# Patient Record
Sex: Female | Born: 1965 | Race: Black or African American | Hispanic: No | Marital: Single | Smoking: Current every day smoker
Health system: Southern US, Community
[De-identification: ages and names within clinical notes are randomized; demographics above are authoritative.]

## PROBLEM LIST (undated history)

## (undated) DIAGNOSIS — Z21 Asymptomatic human immunodeficiency virus [HIV] infection status: Secondary | ICD-10-CM

## (undated) DIAGNOSIS — A630 Anogenital (venereal) warts: Secondary | ICD-10-CM

## (undated) DIAGNOSIS — A159 Respiratory tuberculosis unspecified: Secondary | ICD-10-CM

## (undated) DIAGNOSIS — B029 Zoster without complications: Secondary | ICD-10-CM

## (undated) DIAGNOSIS — D649 Anemia, unspecified: Secondary | ICD-10-CM

## (undated) DIAGNOSIS — B20 Human immunodeficiency virus [HIV] disease: Secondary | ICD-10-CM

## (undated) HISTORY — PX: KNEE DEBRIDEMENT: SHX1894

## (undated) HISTORY — DX: Human immunodeficiency virus (HIV) disease: B20

## (undated) HISTORY — DX: Anogenital (venereal) warts: A63.0

## (undated) HISTORY — DX: Respiratory tuberculosis unspecified: A15.9

## (undated) HISTORY — DX: Anemia, unspecified: D64.9

## (undated) HISTORY — DX: Asymptomatic human immunodeficiency virus (hiv) infection status: Z21

## (undated) HISTORY — DX: Zoster without complications: B02.9

---

## 1998-11-02 ENCOUNTER — Encounter: Payer: Self-pay | Admitting: Emergency Medicine

## 1998-11-03 ENCOUNTER — Inpatient Hospital Stay (HOSPITAL_COMMUNITY): Admission: EM | Admit: 1998-11-03 | Discharge: 1998-11-04 | Payer: Self-pay | Admitting: Emergency Medicine

## 1998-11-04 ENCOUNTER — Encounter: Payer: Self-pay | Admitting: Neurosurgery

## 1999-09-20 ENCOUNTER — Ambulatory Visit (HOSPITAL_COMMUNITY): Admission: RE | Admit: 1999-09-20 | Discharge: 1999-09-20 | Payer: Self-pay | Admitting: Neurosurgery

## 2004-09-22 ENCOUNTER — Emergency Department (HOSPITAL_COMMUNITY): Admission: EM | Admit: 2004-09-22 | Discharge: 2004-09-22 | Payer: Self-pay | Admitting: Emergency Medicine

## 2004-11-21 ENCOUNTER — Ambulatory Visit: Payer: Self-pay | Admitting: Internal Medicine

## 2004-11-21 ENCOUNTER — Inpatient Hospital Stay (HOSPITAL_COMMUNITY): Admission: EM | Admit: 2004-11-21 | Discharge: 2004-12-01 | Payer: Self-pay | Admitting: Emergency Medicine

## 2004-11-23 ENCOUNTER — Ambulatory Visit: Payer: Self-pay | Admitting: Hematology and Oncology

## 2004-11-26 ENCOUNTER — Encounter (INDEPENDENT_AMBULATORY_CARE_PROVIDER_SITE_OTHER): Payer: Self-pay | Admitting: *Deleted

## 2004-11-26 LAB — CONVERTED CEMR LAB
CD4 Count: 180 microliters
CD4 T Cell Abs: 180

## 2004-12-05 ENCOUNTER — Ambulatory Visit: Payer: Self-pay | Admitting: Hematology and Oncology

## 2005-01-04 ENCOUNTER — Ambulatory Visit (HOSPITAL_COMMUNITY): Admission: RE | Admit: 2005-01-04 | Discharge: 2005-01-04 | Payer: Self-pay | Admitting: Hematology and Oncology

## 2005-01-05 ENCOUNTER — Ambulatory Visit: Payer: Self-pay | Admitting: Internal Medicine

## 2005-01-05 ENCOUNTER — Ambulatory Visit (HOSPITAL_COMMUNITY): Admission: RE | Admit: 2005-01-05 | Discharge: 2005-01-05 | Payer: Self-pay | Admitting: Internal Medicine

## 2005-01-05 ENCOUNTER — Encounter (INDEPENDENT_AMBULATORY_CARE_PROVIDER_SITE_OTHER): Payer: Self-pay | Admitting: *Deleted

## 2005-01-05 LAB — CONVERTED CEMR LAB
CD4 Count: 370 microliters
HIV 1 RNA Quant: 399 copies/mL

## 2005-01-20 ENCOUNTER — Ambulatory Visit: Payer: Self-pay | Admitting: Hematology and Oncology

## 2005-02-10 ENCOUNTER — Encounter: Admission: RE | Admit: 2005-02-10 | Discharge: 2005-02-10 | Payer: Self-pay | Admitting: Hematology and Oncology

## 2005-02-13 ENCOUNTER — Ambulatory Visit: Payer: Self-pay | Admitting: Internal Medicine

## 2005-03-13 ENCOUNTER — Encounter (INDEPENDENT_AMBULATORY_CARE_PROVIDER_SITE_OTHER): Payer: Self-pay | Admitting: *Deleted

## 2005-03-13 ENCOUNTER — Ambulatory Visit (HOSPITAL_COMMUNITY): Admission: RE | Admit: 2005-03-13 | Discharge: 2005-03-13 | Payer: Self-pay | Admitting: General Surgery

## 2005-06-20 ENCOUNTER — Ambulatory Visit: Payer: Self-pay | Admitting: Hematology and Oncology

## 2006-03-06 DIAGNOSIS — Z87898 Personal history of other specified conditions: Secondary | ICD-10-CM | POA: Insufficient documentation

## 2006-03-06 DIAGNOSIS — S0990XA Unspecified injury of head, initial encounter: Secondary | ICD-10-CM | POA: Insufficient documentation

## 2006-03-06 DIAGNOSIS — D539 Nutritional anemia, unspecified: Secondary | ICD-10-CM | POA: Insufficient documentation

## 2006-03-06 DIAGNOSIS — F17200 Nicotine dependence, unspecified, uncomplicated: Secondary | ICD-10-CM | POA: Insufficient documentation

## 2006-03-06 DIAGNOSIS — B20 Human immunodeficiency virus [HIV] disease: Secondary | ICD-10-CM | POA: Insufficient documentation

## 2006-03-06 DIAGNOSIS — K7689 Other specified diseases of liver: Secondary | ICD-10-CM | POA: Insufficient documentation

## 2006-03-06 DIAGNOSIS — Z8709 Personal history of other diseases of the respiratory system: Secondary | ICD-10-CM | POA: Insufficient documentation

## 2006-03-12 ENCOUNTER — Encounter (INDEPENDENT_AMBULATORY_CARE_PROVIDER_SITE_OTHER): Payer: Self-pay | Admitting: *Deleted

## 2006-03-12 LAB — CONVERTED CEMR LAB: Pap Smear: ABNORMAL

## 2006-03-14 ENCOUNTER — Encounter: Payer: Self-pay | Admitting: Internal Medicine

## 2006-03-25 ENCOUNTER — Encounter (INDEPENDENT_AMBULATORY_CARE_PROVIDER_SITE_OTHER): Payer: Self-pay | Admitting: *Deleted

## 2006-03-26 DIAGNOSIS — Z9889 Other specified postprocedural states: Secondary | ICD-10-CM

## 2006-03-29 ENCOUNTER — Ambulatory Visit: Payer: Self-pay | Admitting: Internal Medicine

## 2006-03-29 ENCOUNTER — Encounter: Admission: RE | Admit: 2006-03-29 | Discharge: 2006-03-29 | Payer: Self-pay | Admitting: *Deleted

## 2006-03-29 DIAGNOSIS — A630 Anogenital (venereal) warts: Secondary | ICD-10-CM

## 2006-03-29 LAB — CONVERTED CEMR LAB
ALT: 11 units/L (ref 0–35)
AST: 16 units/L (ref 0–37)
Albumin: 4.1 g/dL (ref 3.5–5.2)
Alkaline Phosphatase: 180 units/L — ABNORMAL HIGH (ref 39–117)
BUN: 11 mg/dL (ref 6–23)
CD4 Count: 340 microliters
CO2: 22 meq/L (ref 19–32)
Calcium: 9 mg/dL (ref 8.4–10.5)
Chloride: 107 meq/L (ref 96–112)
Cholesterol: 173 mg/dL (ref 0–200)
Creatinine, Ser: 0.55 mg/dL (ref 0.40–1.20)
Glucose, Bld: 94 mg/dL (ref 70–99)
HCT: 36.3 % (ref 36.0–46.0)
HDL: 56 mg/dL (ref >39)
HIV 1 RNA Quant: <50 copies/mL (ref <50)
HIV-1 RNA Quant, Log: <1.7 (ref <1.70)
Hemoglobin: 12 g/dL (ref 12.0–15.0)
LDL Cholesterol: 103 mg/dL — ABNORMAL HIGH (ref 0–99)
MCHC: 33.1 g/dL (ref 30.0–36.0)
MCV: 78.6 fL (ref 78.0–100.0)
Pap Smear: NORMAL
Platelets: 407 10*3/uL — ABNORMAL HIGH (ref 150–400)
Potassium: 4.1 meq/L (ref 3.5–5.3)
RBC: 4.62 M/uL (ref 3.87–5.11)
RDW: 16.5 % — ABNORMAL HIGH (ref 11.5–14.0)
Sodium: 140 meq/L (ref 135–145)
Total Bilirubin: 0.3 mg/dL (ref 0.3–1.2)
Total CHOL/HDL Ratio: 3.1
Total Protein: 7.9 g/dL (ref 6.0–8.3)
Triglycerides: 68 mg/dL (ref <150)
VLDL: 14 mg/dL (ref 0–40)
WBC: 6 10*3/uL (ref 4.0–10.5)

## 2006-04-04 ENCOUNTER — Encounter: Admission: RE | Admit: 2006-04-04 | Discharge: 2006-04-04 | Payer: Self-pay | Admitting: Hematology and Oncology

## 2007-01-20 ENCOUNTER — Emergency Department (HOSPITAL_COMMUNITY): Admission: EM | Admit: 2007-01-20 | Discharge: 2007-01-20 | Payer: Self-pay | Admitting: Emergency Medicine

## 2007-06-04 ENCOUNTER — Telehealth (INDEPENDENT_AMBULATORY_CARE_PROVIDER_SITE_OTHER): Payer: Self-pay | Admitting: *Deleted

## 2007-06-27 ENCOUNTER — Emergency Department (HOSPITAL_BASED_OUTPATIENT_CLINIC_OR_DEPARTMENT_OTHER): Admission: EM | Admit: 2007-06-27 | Discharge: 2007-06-27 | Payer: Self-pay | Admitting: Emergency Medicine

## 2008-01-13 ENCOUNTER — Emergency Department (HOSPITAL_BASED_OUTPATIENT_CLINIC_OR_DEPARTMENT_OTHER): Admission: EM | Admit: 2008-01-13 | Discharge: 2008-01-13 | Payer: Self-pay | Admitting: Emergency Medicine

## 2008-11-02 ENCOUNTER — Emergency Department (HOSPITAL_BASED_OUTPATIENT_CLINIC_OR_DEPARTMENT_OTHER): Admission: EM | Admit: 2008-11-02 | Discharge: 2008-11-02 | Payer: Self-pay | Admitting: Emergency Medicine

## 2009-12-06 ENCOUNTER — Encounter (INDEPENDENT_AMBULATORY_CARE_PROVIDER_SITE_OTHER): Payer: Self-pay | Admitting: *Deleted

## 2009-12-27 ENCOUNTER — Emergency Department (HOSPITAL_BASED_OUTPATIENT_CLINIC_OR_DEPARTMENT_OTHER)
Admission: EM | Admit: 2009-12-27 | Discharge: 2009-12-27 | Payer: Self-pay | Source: Home / Self Care | Admitting: Emergency Medicine

## 2010-02-15 NOTE — Miscellaneous (Signed)
  Clinical Lists Changes  Observations: Added new observation of YEARAIDSPOS: 2006  (12/06/2009 13:15) Added new observation of HIV STATUS: CDC-defined AIDS  (12/06/2009 13:15)

## 2010-05-30 ENCOUNTER — Inpatient Hospital Stay (HOSPITAL_COMMUNITY)
Admission: EM | Admit: 2010-05-30 | Discharge: 2010-06-02 | DRG: 607 | Disposition: A | Discharge status: Home / Self Care | Payer: Medicaid Other | Attending: Internal Medicine | Admitting: Internal Medicine

## 2010-05-30 ENCOUNTER — Emergency Department (HOSPITAL_COMMUNITY): Payer: Medicaid Other

## 2010-05-30 DIAGNOSIS — F17200 Nicotine dependence, unspecified, uncomplicated: Secondary | ICD-10-CM | POA: Diagnosis present

## 2010-05-30 DIAGNOSIS — D638 Anemia in other chronic diseases classified elsewhere: Secondary | ICD-10-CM | POA: Diagnosis present

## 2010-05-30 DIAGNOSIS — Z21 Asymptomatic human immunodeficiency virus [HIV] infection status: Secondary | ICD-10-CM | POA: Diagnosis present

## 2010-05-30 DIAGNOSIS — R220 Localized swelling, mass and lump, head: Principal | ICD-10-CM | POA: Diagnosis present

## 2010-05-30 DIAGNOSIS — R509 Fever, unspecified: Secondary | ICD-10-CM | POA: Diagnosis present

## 2010-05-30 DIAGNOSIS — L851 Acquired keratosis [keratoderma] palmaris et plantaris: Secondary | ICD-10-CM | POA: Diagnosis present

## 2010-05-30 DIAGNOSIS — Z87820 Personal history of traumatic brain injury: Secondary | ICD-10-CM

## 2010-05-30 LAB — POCT I-STAT, CHEM 8
BUN: 3 mg/dL — ABNORMAL LOW (ref 6–23)
Sodium: 136 mEq/L (ref 135–145)
TCO2: 27 mmol/L (ref 0–100)

## 2010-05-30 LAB — CBC
Platelets: 484 10*3/uL — ABNORMAL HIGH (ref 150–400)
RBC: 3.89 MIL/uL (ref 3.87–5.11)
WBC: 8.1 10*3/uL (ref 4.0–10.5)

## 2010-05-30 LAB — DIFFERENTIAL
Basophils Relative: 0 % (ref 0–1)
Eosinophils Absolute: 0.2 10*3/uL (ref 0.0–0.7)
Neutrophils Relative %: 71 % (ref 43–77)

## 2010-05-30 LAB — URINALYSIS, ROUTINE W REFLEX MICROSCOPIC
Glucose, UA: NEGATIVE mg/dL
Protein, ur: NEGATIVE mg/dL
pH: 7 (ref 5.0–8.0)

## 2010-05-30 LAB — BASIC METABOLIC PANEL
Chloride: 98 mEq/L (ref 96–112)
Potassium: 3.2 mEq/L — ABNORMAL LOW (ref 3.5–5.1)
Sodium: 135 mEq/L (ref 135–145)

## 2010-05-30 LAB — PREGNANCY, URINE: Preg Test, Ur: NEGATIVE

## 2010-05-30 MED ORDER — IOHEXOL 300 MG/ML  SOLN
100.0000 mL | Freq: Once | INTRAMUSCULAR | Status: AC | PRN
  Administered 2010-05-30: 100 mL via INTRAVENOUS

## 2010-05-31 DIAGNOSIS — R221 Localized swelling, mass and lump, neck: Secondary | ICD-10-CM

## 2010-05-31 DIAGNOSIS — J189 Pneumonia, unspecified organism: Secondary | ICD-10-CM

## 2010-05-31 DIAGNOSIS — B20 Human immunodeficiency virus [HIV] disease: Secondary | ICD-10-CM

## 2010-05-31 DIAGNOSIS — R220 Localized swelling, mass and lump, head: Secondary | ICD-10-CM

## 2010-05-31 DIAGNOSIS — A150 Tuberculosis of lung: Secondary | ICD-10-CM

## 2010-05-31 LAB — CRYPTOCOCCAL ANTIGEN: Crypto Ag: NEGATIVE

## 2010-05-31 LAB — LIPID PANEL
Cholesterol: 109 mg/dL (ref 0–200)
VLDL: 16 mg/dL (ref 0–40)

## 2010-05-31 LAB — T-HELPER CELLS (CD4) COUNT (NOT AT ARMC): CD4 T Cell Abs: 60 uL — ABNORMAL LOW (ref 400–2700)
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
## 2010-06-01 ENCOUNTER — Other Ambulatory Visit: Payer: Self-pay | Admitting: Interventional Radiology

## 2010-06-01 ENCOUNTER — Inpatient Hospital Stay (HOSPITAL_COMMUNITY): Payer: Medicaid Other

## 2010-06-01 DIAGNOSIS — R221 Localized swelling, mass and lump, neck: Secondary | ICD-10-CM

## 2010-06-01 DIAGNOSIS — A150 Tuberculosis of lung: Secondary | ICD-10-CM

## 2010-06-01 DIAGNOSIS — J189 Pneumonia, unspecified organism: Secondary | ICD-10-CM

## 2010-06-01 DIAGNOSIS — R220 Localized swelling, mass and lump, head: Secondary | ICD-10-CM

## 2010-06-01 DIAGNOSIS — B20 Human immunodeficiency virus [HIV] disease: Secondary | ICD-10-CM

## 2010-06-01 LAB — HEPATITIS PANEL, ACUTE
Hep B C IgM: NEGATIVE
Hepatitis B Surface Ag: NEGATIVE

## 2010-06-01 LAB — BASIC METABOLIC PANEL
Calcium: 8.2 mg/dL — ABNORMAL LOW (ref 8.4–10.5)
Creatinine, Ser: <0.47 mg/dL (ref 0.4–1.2)

## 2010-06-01 LAB — CBC
HCT: 27.9 % — ABNORMAL LOW (ref 36.0–46.0)
Platelets: 399 10*3/uL (ref 150–400)
RDW: 16.2 % — ABNORMAL HIGH (ref 11.5–15.5)
WBC: 7.2 10*3/uL (ref 4.0–10.5)

## 2010-06-01 LAB — PROTIME-INR
INR: 1.06 (ref 0.00–1.49)
Prothrombin Time: 14 seconds (ref 11.6–15.2)

## 2010-06-01 LAB — RPR: RPR Ser Ql: NONREACTIVE

## 2010-06-02 LAB — CBC
HCT: 28 % — ABNORMAL LOW (ref 36.0–46.0)
Hemoglobin: 8.6 g/dL — ABNORMAL LOW (ref 12.0–15.0)
MCH: 22.4 pg — ABNORMAL LOW (ref 26.0–34.0)
MCHC: 30.7 g/dL (ref 30.0–36.0)
RBC: 3.84 MIL/uL — ABNORMAL LOW (ref 3.87–5.11)

## 2010-06-02 LAB — IRON AND TIBC
Iron: 15 ug/dL — ABNORMAL LOW (ref 42–135)
Saturation Ratios: 6 % — ABNORMAL LOW (ref 20–55)
TIBC: 237 ug/dL — ABNORMAL LOW (ref 250–470)
UIBC: 222 ug/dL

## 2010-06-02 LAB — FERRITIN: Ferritin: 91 ng/mL (ref 10–291)

## 2010-06-02 LAB — VITAMIN B12: Vitamin B-12: 237 pg/mL (ref 211–911)

## 2010-06-03 LAB — WOUND CULTURE: Culture: NO GROWTH

## 2010-06-03 NOTE — Discharge Summary (Signed)
Melinda Nielsen, Melinda Nielsen               ACCOUNT NO.:  [id number]  MEDICAL RECORD NO.:  [id number]           PATIENT TYPE:  I  LOCATION:  1341                         FACILITY:  [facility]  PHYSICIAN:  Andreas Blower, MD       DATE OF BIRTH:  1965-05-05  DATE OF ADMISSION:  05/30/2010 DATE OF DISCHARGE:                              DISCHARGE SUMMARY   PRIMARY CARE PHYSICIAN:  None.  INFECTIOUS DISEASES DOCTOR:  Lacretia Leigh. Ninetta Lights, M.D.  DISCHARGE DIAGNOSIS: 1. Large painful neck mass, likely tuberculosis adenitis versus     possible lymphoma given of human immunodeficiency virus. 2. Human immunodeficiency virus/acquired immunodeficiency syndrome, CD-     4 count 60. 3. Anemia.  Anemia panel pending. 4. Dry skin. 5. Low-grade fever likely due to neck mass.  BRIEF ADMITTING HISTORY AND PHYSICAL:  [name] is a 45 year old African-American female who is HIV positive for several years with no therapy who presented to the ER with an enlarging painful mass on the right neck.  DISCHARGE MEDICATIONS: 1. Azithromycin 1200 mg p.o. every week. 2. Acetaminophen 650 mg every 6 hours as needed for fever. 3. Bactrim double strength 800/160 one tablet on Monday, Wednesday and     Friday. 4. Ferrous sulfate 325 mg p.o. day. 5. Percocet 5/325 one tablet p.o. q.6 h.  RADIOLOGY/IMAGING:  The patient had CT of the neck which showed a right mid to posterior neck mass measuring 3.6 x 4.2 with central liquefied appearance.  The patient had chest x-ray, two-view, which shows diffuse bilateral infiltrates.  This may be pneumonia.  Tuberculosis pneumonia IS likely given right neck mass and HIV.  The patient had ultrasound-guided right cervical aspiration and core biopsy on Jun 01, 2010.  CONSULTATIONS:  Dr. Ninetta Lights from Infectious Disease evaluated the patient.  LABORATORY DATA:  CBC shows white count of 7.1, hemoglobin 8.6, hematocrit 28.0, platelet count 463.  Electrolytes normal except sodium is  134, potassium 3.4, BUN 6, creatinine is less than 0.47, LDL is 73. Hepatitis panel was negative.  Urine pregnancy was negative.  UA was negative.  CD-4 count was 60.  RPR is nonreactive.  Cryptococcal antigen is negative.  GC and Chlamydia are negative.  Sputum culture pending. AFB smear is pending.  HOSPITAL COURSE BY PROBLEM: 1. Neck mass.  Given the patient's HIV status,  Infectious Disease was     consulted.  There is a possibility that this neck mass may be     due to the tuberculosis adenitis (Scrofula) versus possible     lymphoma. Infection Disease was consulted and the biopsy was done     and the results were pending.  Per the Infectious Disease, the     patient could be discharged home with using mask in public areas     until following up in clinic to discuss the biopsy results.     Antibiotics were not started until biopsy results came back. 2. HIV/AIDS.  CD-4 count was 60 after discussion with Infectious     Disease.  The patient was started on azithromycin 1200 mg p.o.     every week and Bactrim  double strength one tablet on Monday     and Friday for prophylaxis. 3. Anemia likely due to chronic disease.  Anemia panel has been sent     and is pending.  Started the patient on ferrous sulfate 325 mg p.o.     daily.  Based on anemia panel, could discontinue ferrous sulfate at     the next clinic visit if the patient has adequate iron stores. 4. Dry skin.  Place the patient on moisturizing solution. 5. Low-grade fever likely from possible tuberculosis adenitis versus     lymphoma from the neck mass.  Per Infectious Disease, okay to     discharge the patient until being seen in clinic.  DISPOSITION AND FOLLOWUP:  The patient to follow with Dr. Ninetta Lights with Infectious Disease on Jun 08, 2010, at 11:15 a.m..  I had a discussion with the patient and strongly encouraged her to keep that appointment.  Time spent on discharge, talking to the patient and coordinating care was 25  minutes.     Andreas Blower, MD     SR/MEDQ  D:  06/02/2010  T:  06/02/2010  Job:  [id number]  Electronically Signed by Wardell Heath Darrion Macaulay  on 06/03/2010 08:27:52 PM

## 2010-06-03 NOTE — Op Note (Signed)
NAME:  Melinda Nielsen, Melinda Nielsen NO.:  [id number]   MEDICAL RECORD NO.:  [id number]          PATIENT TYPE:  AMB   LOCATION:  DAY                          FACILITY:  [facility]   PHYSICIAN:  Leonie Man, M.D.   DATE OF BIRTH:  26-Oct-1965   DATE OF PROCEDURE:  03/13/2005  DATE OF DISCHARGE:                                 OPERATIVE REPORT   PREOPERATIVE DIAGNOSES:  1.  Anal condylomata.  2.  External hemorrhoidal disease.   POSTOPERATIVE DIAGNOSES:  1.  Anal condylomata.  2.  External hemorrhoidal disease.   PROCEDURE:  1.  Fulguration of anal condylomata.  2.  Excision of external hemorrhoidal disease.   SURGEON:  Leonie Man, M.D.   ASSISTANT:  OR tech.   ANESTHESIA:  General.   INDICATIONS:  The patient is a 45 year old female presenting primarily with  the rectal bleeding.  She is diagnosed to be HIV positive.  She has a CD-4  count which is greater than 300.  The patient is currently on Atripla and  Septra DS which she takes daily.  On examination of the rectum and anus  preoperatively, the patient was noted to have a significant ring of  condylomata just below the dentate line.  There were no external condylomata  noted but she also was noted to have very extensive anterior midline  external hemorrhoid which was also problematic for her.  She comes to the  operating room now after risks and potential benefits of surgery have been  discussed. All questions answered and consent obtained.  It should be noted  that the The patient noted the patient has undergone complete evaluation  with colonoscopy for colorectal disease and none was noted.   DESCRIPTION OF PROCEDURE:  Following the induction of satisfactory general  anesthesia, the patient was positioned in the lithotomy position and the  perianal tissues were prepped and draped to be included in a sterile  operative field.  We used special evacuating suction so as to reduce any old  viral clot into the  operating room.  Then the perianal tissues were then  infiltrated with 1% lidocaine with epinephrine and anoscope inserted into  the anus.  The condylomata were serially fulgurated using electrocautery in  the entire circumference of the anus.  At the end of this, using  electrocautery to dissect free the anterior midline hemorrhoid and cauterize  the space. The mucocutaneous defect was then closed with a running suture of  3-0 chromic suture.  All areas of dissection were  checked for hemostasis and noted be dry.  I used a Gelfoam patch placed  within the anus over the incision.  Sponge and instrument counts were  verified. The anesthetic reversed and the patient removed from the operating  room to the recovery room in stable condition.  She tolerated the procedure  well.      Leonie Man, M.D.  Electronically Signed     PB/MEDQ  D:  03/13/2005  T:  03/14/2005  Job:  [id number]

## 2010-06-06 LAB — HIV-1 RNA QUANT-NO REFLEX-BLD: HIV 1 RNA Quant: 43300 copies/mL — ABNORMAL HIGH (ref <20)

## 2010-06-08 ENCOUNTER — Ambulatory Visit (HOSPITAL_COMMUNITY)
Admission: RE | Admit: 2010-06-08 | Discharge: 2010-06-08 | Disposition: A | Discharge status: Home / Self Care | Payer: Self-pay | Source: Ambulatory Visit | Attending: Infectious Diseases | Admitting: Infectious Diseases

## 2010-06-08 ENCOUNTER — Encounter: Payer: Self-pay | Admitting: Infectious Diseases

## 2010-06-08 ENCOUNTER — Ambulatory Visit (INDEPENDENT_AMBULATORY_CARE_PROVIDER_SITE_OTHER): Payer: Self-pay | Admitting: Infectious Diseases

## 2010-06-08 DIAGNOSIS — B20 Human immunodeficiency virus [HIV] disease: Secondary | ICD-10-CM

## 2010-06-08 DIAGNOSIS — A318 Other mycobacterial infections: Secondary | ICD-10-CM | POA: Insufficient documentation

## 2010-06-08 DIAGNOSIS — F17200 Nicotine dependence, unspecified, uncomplicated: Secondary | ICD-10-CM | POA: Insufficient documentation

## 2010-06-08 DIAGNOSIS — K7689 Other specified diseases of liver: Secondary | ICD-10-CM | POA: Insufficient documentation

## 2010-06-08 DIAGNOSIS — A311 Cutaneous mycobacterial infection: Secondary | ICD-10-CM

## 2010-06-08 MED ORDER — EFAVIRENZ-EMTRICITAB-TENOFOVIR 600-200-300 MG PO TABS: 1.0000 | ORAL_TABLET | Freq: Every day | ORAL | Status: DC

## 2010-06-08 NOTE — Progress Notes (Signed)
Addended byNinetta Lights, Helina Hullum on: 06/08/2010 01:38 PM   Modules accepted: Orders

## 2010-06-08 NOTE — Progress Notes (Signed)
  Subjective:    Patient ID: Melinda Nielsen, female    DOB: 1965/12/25, 45 y.o.   MRN: [id number]  HPI 45 yo F with hx of HIV+ since 2006, dx with an ? Illness. She was previously on [place] but has been lost to follow up ~4 years. By her history, she has no history of drug failure.  She went to the ED 05-31-10 with 3 weeks of enlarging, painful right neck mass. She was seen at the onset of her illness in urgent care and given penicillin with only transient improvement. She has lost 5# during this period. She had a non-productive cough, no hemoptysis, no chest pain. She has no history of incarceration, homelessness.  In the hospital she was found to have a "right mid to  posterior neck mass measuring 3.6 x 4.2 cm with central liquefied appearance.  This appears to be due to multiple  enlarged   lymph nodes.  This extends into the right parapharyngeal space" on CT scan. Her CXR showed diffuse bilateral infiltrates. She underwent US guided aspirate (AFB stain negative, fungal stain negative, routine Cx negative). Her pathology showed - "FIBROSIS, HISTIOCYTIC INFILTRATES, INFLAMMATION AND FOCAL NECROSIS.  - NO MALIGNANCY IDENTIFIED." Her CD4 was 60, VL 43,300. She was d/c home on bactrim and azithromycin .  Has felt ok since d/c from hospital.    Review of Systems  Constitutional: Positive for fever and chills. Negative for unexpected weight change.  Respiratory: Positive for chest tightness and shortness of breath. Negative for cough.   Gastrointestinal: Negative for diarrhea and constipation.  Genitourinary: Negative for difficulty urinating.       Objective:   Physical Exam  Constitutional: She appears well-developed and well-nourished.  HENT:  Head:    Eyes: EOM are normal. Pupils are equal, round, and reactive to light.  Cardiovascular: Normal rate and regular rhythm.   Pulmonary/Chest: Effort normal and breath sounds normal. No respiratory distress. She has no wheezes. She has no rales.    Abdominal: Soft. Bowel sounds are normal. She exhibits no distension.  Lymphadenopathy:    She has no axillary adenopathy.          Assessment & Plan:

## 2010-06-08 NOTE — Assessment & Plan Note (Addendum)
Will send her to the health dept. Presume that this is tb til proven otherwise. I am assuming her chest discomfort is from this as well. Her ECG in the clinic is NSR 87 bpm. No st-t changes.

## 2010-06-08 NOTE — Assessment & Plan Note (Signed)
She has AIDS and needs to continue on her prophylactics. She will be restarted on her atripla. Will have her meet with assistance personal to get her onto ADAP and back on her meds. She is given condoms. She will Return to clinic in 6-7 weeks.

## 2010-06-16 ENCOUNTER — Ambulatory Visit: Payer: Self-pay

## 2010-06-16 ENCOUNTER — Other Ambulatory Visit: Payer: Self-pay | Admitting: Infectious Diseases

## 2010-06-16 DIAGNOSIS — A311 Cutaneous mycobacterial infection: Secondary | ICD-10-CM

## 2010-06-22 ENCOUNTER — Encounter: Payer: Self-pay | Admitting: Infectious Diseases

## 2010-06-23 ENCOUNTER — Other Ambulatory Visit: Payer: Self-pay | Admitting: *Deleted

## 2010-06-23 DIAGNOSIS — B20 Human immunodeficiency virus [HIV] disease: Secondary | ICD-10-CM

## 2010-06-23 MED ORDER — AZITHROMYCIN 600 MG PO TABS: 1200.0000 mg | ORAL_TABLET | ORAL | Status: DC

## 2010-06-23 MED ORDER — SULFAMETHOXAZOLE-TMP DS 800-160 MG PO TABS: 1.0000 | ORAL_TABLET | Freq: Every day | ORAL | Status: DC

## 2010-06-23 MED ORDER — EFAVIRENZ-EMTRICITAB-TENOFOVIR 600-200-300 MG PO TABS: 1.0000 | ORAL_TABLET | Freq: Every day | ORAL | Status: DC

## 2010-06-27 ENCOUNTER — Telehealth: Payer: Self-pay | Admitting: *Deleted

## 2010-06-27 NOTE — Telephone Encounter (Signed)
Patient scheduled appointment at [facility] ENT clinic for 07/06/10 at 1:00 pm with Dr. Dola Argyle.  Office note and Imaging reports faxed.  Patient needs to call financial services at [facility] at [phone].  Left patient voicemail to call back regarding appointment information. Wendall Mola CMA

## 2010-06-29 ENCOUNTER — Other Ambulatory Visit: Payer: Self-pay | Admitting: Infectious Diseases

## 2010-06-29 DIAGNOSIS — A311 Cutaneous mycobacterial infection: Secondary | ICD-10-CM

## 2010-06-29 DIAGNOSIS — B20 Human immunodeficiency virus [HIV] disease: Secondary | ICD-10-CM

## 2010-06-29 MED ORDER — FERROUS SULFATE 325 (65 FE) MG PO TABS: 325.0000 mg | ORAL_TABLET | Freq: Every day | ORAL | Status: DC

## 2010-06-29 MED ORDER — AZITHROMYCIN 600 MG PO TABS: 1200.0000 mg | ORAL_TABLET | ORAL | Status: DC

## 2010-06-29 MED ORDER — OXYCODONE-ACETAMINOPHEN 5-325 MG PO TABS: 1.0000 | ORAL_TABLET | Freq: Four times a day (QID) | ORAL | Status: DC | PRN

## 2010-06-30 ENCOUNTER — Telehealth: Payer: Self-pay | Admitting: *Deleted

## 2010-06-30 NOTE — Telephone Encounter (Signed)
Spoke with patient regarding a request she had made for Percocet she had received in the hospital.  She told me she no longer needed it.  Said she has been having some itching from what she thinks is eczema.  Has been taking Benadryl with no relief.  Said she will address this with Dr. Ninetta Lights at next appointment.   Wendall Mola CMA

## 2010-07-01 LAB — FUNGUS CULTURE W SMEAR

## 2010-07-05 ENCOUNTER — Other Ambulatory Visit: Payer: Self-pay | Admitting: Licensed Clinical Social Worker

## 2010-07-05 DIAGNOSIS — R52 Pain, unspecified: Secondary | ICD-10-CM

## 2010-07-05 MED ORDER — TRAMADOL HCL 50 MG PO TABS: 50.0000 mg | ORAL_TABLET | Freq: Four times a day (QID) | ORAL | Status: DC | PRN

## 2010-07-07 ENCOUNTER — Ambulatory Visit: Payer: Self-pay | Admitting: Infectious Diseases

## 2010-07-07 ENCOUNTER — Telehealth: Payer: Self-pay | Admitting: *Deleted

## 2010-07-07 DIAGNOSIS — IMO0002 Reserved for concepts with insufficient information to code with codable children: Secondary | ICD-10-CM

## 2010-07-07 NOTE — Telephone Encounter (Signed)
Patient was seen at [facility] ENT clinic yesterday.  Two days ago the mass on her neck came to a head and burst.  [facility]  Dr. Dola Argyle packed it and instructed patient that dressing needed to be changed every day.  They assumed the TB nurse that sees her everyday would be able to change it, was told the nurse could not change the dressing.  [facility] and they will try to arrange an appointment at the wound clinic.  For today's dressing change patient given a nurse appointment at 3:00 pm. Wendall Mola CMA

## 2010-07-08 ENCOUNTER — Encounter: Payer: Self-pay | Admitting: Infectious Diseases

## 2010-07-08 DIAGNOSIS — IMO0002 Reserved for concepts with insufficient information to code with codable children: Secondary | ICD-10-CM

## 2010-07-11 ENCOUNTER — Telehealth: Payer: Self-pay | Admitting: *Deleted

## 2010-07-11 ENCOUNTER — Ambulatory Visit: Payer: Self-pay

## 2010-07-11 ENCOUNTER — Encounter (HOSPITAL_BASED_OUTPATIENT_CLINIC_OR_DEPARTMENT_OTHER): Payer: Self-pay | Attending: Internal Medicine

## 2010-07-11 DIAGNOSIS — R599 Enlarged lymph nodes, unspecified: Secondary | ICD-10-CM | POA: Insufficient documentation

## 2010-07-11 DIAGNOSIS — A150 Tuberculosis of lung: Secondary | ICD-10-CM | POA: Insufficient documentation

## 2010-07-11 DIAGNOSIS — T8189XA Other complications of procedures, not elsewhere classified, initial encounter: Secondary | ICD-10-CM | POA: Insufficient documentation

## 2010-07-11 DIAGNOSIS — Z21 Asymptomatic human immunodeficiency virus [HIV] infection status: Secondary | ICD-10-CM | POA: Insufficient documentation

## 2010-07-11 DIAGNOSIS — Y838 Other surgical procedures as the cause of abnormal reaction of the patient, or of later complication, without mention of misadventure at the time of the procedure: Secondary | ICD-10-CM | POA: Insufficient documentation

## 2010-07-11 NOTE — Telephone Encounter (Signed)
Scheduled appointment for patient at wound center for 07/11/10 at 1:00 pm.  She has an open wound on right side of neck at the area where the mass had been. Was packed first at [facility] ENT clinic and twice here at this clinic.  Patient aware of appointment. Wendall Mola CMA

## 2010-07-12 NOTE — Assessment & Plan Note (Unsigned)
[facility]  NAME:  Melinda Nielsen, Melinda Nielsen                 ACCOUNT NO.:  [id number]  MEDICAL RECORD NO.:  [id number]      DATE OF BIRTH:  07-Jul-1965  PHYSICIAN:  Jonelle Sports. Sevier, M.D.  VISIT DATE:  07/11/2010                                  OFFICE VISIT   This 45 year old black female comes in for evaluation and advice regarding draining lesion in her right high anterior cervical area.  The patient has a fairly complex history with HIV positivity, for which she is currently on treatment and also for active TB (it is unknown to me at this point and from the records available to me whether this is an atypical TB or not).  Whatever, she has been seen at the ENT Clinic in [facility] where apparently this lymph node was drained and biopsied and found to show no malignancy and any cultures made at that time were still pending, it was cultured both for routine things as well as for acid fast fungus, etc.  They have had her packing the wound since the time that it was incised and drained and she has had some difficulty with that and a referral here was for [place] to see if there is a way we can assist her in that management.  In observation, her lymph node is still rather enlarged, but not nearly so tender she says that has been before and does not feel fluctuant. There is no persisting warmth or erythema in the area and the wound itself which measures 1.5 x 1.2 and is probable only to about 0.6 cm. It appears perfectly satisfactory with no significant active drainage at the moment.  It is my impression that this wound should be left open until the nature of the organism is known for certain and a decision then made whether to continue with appropriate and specific therapy or whether to consider an actual excision of the nodule.  Whatever, I think that any efforts to close this wound at this point are contraindicated.  I do not think realistically, however, that she will be  able to continue packing, it was such a small remaining tract and accordingly we have suggested she dress it externally to a level of her comfort and sanitation.  She is due to see [facility] again in [place] next week hopefully for a more detailed results of culture etc., although we are aware and she was advised that Mycobacterium cultures may well take 6-8 weeks before they can be considered negative.  She seems happy with this recommendation and with our dressing recommendations and will be set for followup here only on a p.r.n. basis.          ______________________________ Jonelle Sports Cheryll Cockayne, M.D.     RES/MEDQ  D:  07/11/2010  T:  07/12/2010  Job:  [id number]  cc:   Lacretia Leigh. Ninetta Lights, M.D.

## 2010-07-14 LAB — AFB CULTURE WITH SMEAR (NOT AT ARMC): Acid Fast Smear: NONE SEEN

## 2010-07-27 ENCOUNTER — Encounter: Payer: Self-pay | Admitting: Infectious Diseases

## 2010-07-27 ENCOUNTER — Ambulatory Visit (INDEPENDENT_AMBULATORY_CARE_PROVIDER_SITE_OTHER): Payer: Self-pay | Admitting: Infectious Diseases

## 2010-07-27 DIAGNOSIS — Z23 Encounter for immunization: Secondary | ICD-10-CM

## 2010-07-27 DIAGNOSIS — A311 Cutaneous mycobacterial infection: Secondary | ICD-10-CM

## 2010-07-27 DIAGNOSIS — L309 Dermatitis, unspecified: Secondary | ICD-10-CM

## 2010-07-27 DIAGNOSIS — L259 Unspecified contact dermatitis, unspecified cause: Secondary | ICD-10-CM

## 2010-07-27 DIAGNOSIS — A318 Other mycobacterial infections: Secondary | ICD-10-CM

## 2010-07-27 DIAGNOSIS — D539 Nutritional anemia, unspecified: Secondary | ICD-10-CM

## 2010-07-27 DIAGNOSIS — B20 Human immunodeficiency virus [HIV] disease: Secondary | ICD-10-CM

## 2010-07-27 MED ORDER — HYDROCORTISONE 1 % EX CREA: TOPICAL_CREAM | CUTANEOUS | Status: AC

## 2010-07-27 MED ORDER — PRENATAL RX 60-1 MG PO TABS: 1.0000 | ORAL_TABLET | Freq: Every day | ORAL | Status: AC

## 2010-07-27 NOTE — Assessment & Plan Note (Signed)
My great appreciation to Dr Roxan Hockey and health dept. Will cc them.

## 2010-07-27 NOTE — Assessment & Plan Note (Signed)
Start prenatal vitamins

## 2010-07-27 NOTE — Assessment & Plan Note (Addendum)
Will check her CD4 and VL today. Give her PNVX and condoms. Hopefully can stop azithro and bactrim.  See her back in 2-3 months.

## 2010-07-27 NOTE — Progress Notes (Signed)
  Subjective:    Patient ID: Melinda Nielsen, female    DOB: 1965/06/11, 45 y.o.   MRN: [id number]  HPI 45 yo F with hx of HIV+ since 2006, dx with an ? Illness. She was previously on [place] but has been lost to follow up ~4 years. By her history, she has no history of drug failure. She went to the ED 05-31-10 with 3 weeks of enlarging, painful right neck mass. She was seen at the onset of her illness in urgent care and given penicillin with only transient improvement. She has lost 5# during this period. She had a non-productive cough, no hemoptysis, no chest pain. She has no history of incarceration, homelessness.  In the hospital she was found to have a "right mid to posterior neck mass measuring 3.6 x 4.2 cm with central liquefied appearance. This appears to be due to multiple enlarged lymph nodes. This extends into the right parapharyngeal space" on CT scan. Her CXR showed diffuse bilateral infiltrates. She underwent US guided aspirate (AFB stain negative, fungal stain negative, routine Cx negative). Her pathology showed - "FIBROSIS, HISTIOCYTIC INFILTRATES, INFLAMMATION AND FOCAL NECROSIS. - NO MALIGNANCY IDENTIFIED." Her CD4 was 60, VL 43,300. Genotype background PR mutation  (K20R,L33Vs). She was d/c home on bactrim and azithromycin .  She returned to ID clinic 06-08-10 and was restarted on atripla. She has been eval at [facility] for consideration of resection of her scrofulous area but the area broke down and began to drain. It has since healed well (per [facility] notes). She was eval at health dept and was started on anti-TB medications.  [name] feels like her neck is getting better. No further d/c and wound has closed. Doing well with atripla and TB medications (she is unable to remember names).  Was having joint pain and was told she had gout. Also tired all the time and feels like her iron is low. Needs skin cream due to pruritis and dry skin.  Having some problems with nausea due to all her meds.   Review  of Systems     Objective:   Physical Exam  Constitutional: She appears well-developed and well-nourished.  Eyes: EOM are normal. Pupils are equal, round, and reactive to light.  Neck: Neck supple.    Cardiovascular: Normal rate, regular rhythm and normal heart sounds.   Pulmonary/Chest: Effort normal and breath sounds normal. No respiratory distress.  Abdominal: Soft. Bowel sounds are normal. She exhibits no distension. There is no tenderness.  Skin:             Assessment & Plan:

## 2010-07-27 NOTE — Progress Notes (Signed)
Addended by: Wendall Mola A on: 07/27/2010 12:22 PM   Modules accepted: Orders

## 2010-07-27 NOTE — Assessment & Plan Note (Signed)
Will try her on low dose steroid cream.

## 2010-07-28 LAB — COMPREHENSIVE METABOLIC PANEL
ALT: 17 U/L (ref 0–35)
AST: 26 U/L (ref 0–37)
Albumin: 4 g/dL (ref 3.5–5.2)
BUN: 10 mg/dL (ref 6–23)
Calcium: 9.2 mg/dL (ref 8.4–10.5)
Chloride: 102 mEq/L (ref 96–112)
Potassium: 4.2 mEq/L (ref 3.5–5.3)

## 2010-07-28 LAB — CBC
MCHC: 31.9 g/dL (ref 30.0–36.0)
RDW: 23.3 % — ABNORMAL HIGH (ref 11.5–15.5)

## 2010-07-28 LAB — HIV-1 RNA QUANT-NO REFLEX-BLD
HIV 1 RNA Quant: 87 copies/mL — ABNORMAL HIGH (ref <20)
HIV-1 RNA Quant, Log: 1.94 {Log} — ABNORMAL HIGH (ref <1.30)

## 2010-07-29 LAB — T-HELPER CELL (CD4) - (RCID CLINIC ONLY)
CD4 % Helper T Cell: 10 % — ABNORMAL LOW (ref 33–55)
CD4 T Cell Abs: 270 uL — ABNORMAL LOW (ref 400–2700)

## 2010-08-08 ENCOUNTER — Encounter (HOSPITAL_BASED_OUTPATIENT_CLINIC_OR_DEPARTMENT_OTHER): Payer: Self-pay | Attending: Internal Medicine

## 2010-08-08 DIAGNOSIS — T8189XA Other complications of procedures, not elsewhere classified, initial encounter: Secondary | ICD-10-CM | POA: Insufficient documentation

## 2010-08-08 DIAGNOSIS — Y838 Other surgical procedures as the cause of abnormal reaction of the patient, or of later complication, without mention of misadventure at the time of the procedure: Secondary | ICD-10-CM | POA: Insufficient documentation

## 2010-08-08 DIAGNOSIS — Z21 Asymptomatic human immunodeficiency virus [HIV] infection status: Secondary | ICD-10-CM | POA: Insufficient documentation

## 2010-08-08 DIAGNOSIS — R599 Enlarged lymph nodes, unspecified: Secondary | ICD-10-CM | POA: Insufficient documentation

## 2010-08-08 DIAGNOSIS — A150 Tuberculosis of lung: Secondary | ICD-10-CM | POA: Insufficient documentation

## 2010-08-22 ENCOUNTER — Other Ambulatory Visit: Payer: Self-pay | Admitting: Infectious Diseases

## 2010-08-22 ENCOUNTER — Ambulatory Visit
Admission: RE | Admit: 2010-08-22 | Discharge: 2010-08-22 | Disposition: A | Discharge status: Home / Self Care | Payer: No Typology Code available for payment source | Source: Ambulatory Visit | Attending: Infectious Diseases | Admitting: Infectious Diseases

## 2010-08-22 DIAGNOSIS — Z09 Encounter for follow-up examination after completed treatment for conditions other than malignant neoplasm: Secondary | ICD-10-CM

## 2010-10-17 ENCOUNTER — Other Ambulatory Visit: Payer: Self-pay | Admitting: *Deleted

## 2010-10-17 ENCOUNTER — Ambulatory Visit: Payer: No Typology Code available for payment source

## 2010-10-17 DIAGNOSIS — B20 Human immunodeficiency virus [HIV] disease: Secondary | ICD-10-CM

## 2010-10-17 MED ORDER — SULFAMETHOXAZOLE-TMP DS 800-160 MG PO TABS: 1.0000 | ORAL_TABLET | Freq: Every day | ORAL | Status: DC

## 2010-10-17 MED ORDER — AZITHROMYCIN 600 MG PO TABS: 1200.0000 mg | ORAL_TABLET | ORAL | Status: DC

## 2010-10-17 MED ORDER — EFAVIRENZ-EMTRICITAB-TENOFOVIR 600-200-300 MG PO TABS: 1.0000 | ORAL_TABLET | Freq: Every day | ORAL | Status: DC

## 2010-10-24 ENCOUNTER — Other Ambulatory Visit: Payer: Self-pay | Admitting: Infectious Diseases

## 2010-10-24 DIAGNOSIS — B20 Human immunodeficiency virus [HIV] disease: Secondary | ICD-10-CM

## 2010-10-31 ENCOUNTER — Other Ambulatory Visit: Payer: Self-pay | Admitting: Infectious Diseases

## 2010-10-31 ENCOUNTER — Other Ambulatory Visit (INDEPENDENT_AMBULATORY_CARE_PROVIDER_SITE_OTHER): Payer: Self-pay

## 2010-10-31 DIAGNOSIS — B20 Human immunodeficiency virus [HIV] disease: Secondary | ICD-10-CM

## 2010-10-31 LAB — CBC WITH DIFFERENTIAL/PLATELET
Basophils Absolute: 0.1 10*3/uL (ref 0.0–0.1)
Basophils Relative: 1 % (ref 0–1)
Eosinophils Absolute: 0.2 10*3/uL (ref 0.0–0.7)
Eosinophils Relative: 4 % (ref 0–5)
HCT: 29.6 % — ABNORMAL LOW (ref 36.0–46.0)
Hemoglobin: 9.3 g/dL — ABNORMAL LOW (ref 12.0–15.0)
MCH: 23.8 pg — ABNORMAL LOW (ref 26.0–34.0)
MCHC: 31.4 g/dL (ref 30.0–36.0)
MCV: 75.7 fL — ABNORMAL LOW (ref 78.0–100.0)
Monocytes Absolute: 0.3 10*3/uL (ref 0.1–1.0)
Monocytes Relative: 8 % (ref 3–12)
Neutro Abs: 1.7 10*3/uL (ref 1.7–7.7)
RDW: 18.6 % — ABNORMAL HIGH (ref 11.5–15.5)

## 2010-10-31 LAB — COMPREHENSIVE METABOLIC PANEL
ALT: 13 U/L (ref 0–35)
Albumin: 3.7 g/dL (ref 3.5–5.2)
Alkaline Phosphatase: 121 U/L — ABNORMAL HIGH (ref 39–117)
Glucose, Bld: 80 mg/dL (ref 70–99)
Potassium: 3.9 mEq/L (ref 3.5–5.3)
Sodium: 139 mEq/L (ref 135–145)
Total Bilirubin: 0.2 mg/dL — ABNORMAL LOW (ref 0.3–1.2)
Total Protein: 7.3 g/dL (ref 6.0–8.3)

## 2010-11-01 LAB — T-HELPER CELL (CD4) - (RCID CLINIC ONLY): CD4 % Helper T Cell: 8 % — ABNORMAL LOW (ref 33–55)

## 2010-11-07 LAB — HIV-1 RNA QUANT-NO REFLEX-BLD: HIV-1 RNA Quant, Log: <1.3 {Log} (ref <1.30)

## 2010-11-14 ENCOUNTER — Encounter: Payer: Self-pay | Admitting: Infectious Diseases

## 2010-11-14 ENCOUNTER — Ambulatory Visit (INDEPENDENT_AMBULATORY_CARE_PROVIDER_SITE_OTHER): Payer: Self-pay | Admitting: Infectious Diseases

## 2010-11-14 VITALS — BP 144/92 | HR 103 | Temp 97.6°F | Ht 66.0 in | Wt 168.0 lb

## 2010-11-14 DIAGNOSIS — B20 Human immunodeficiency virus [HIV] disease: Secondary | ICD-10-CM

## 2010-11-14 DIAGNOSIS — Z79899 Other long term (current) drug therapy: Secondary | ICD-10-CM

## 2010-11-14 DIAGNOSIS — A311 Cutaneous mycobacterial infection: Secondary | ICD-10-CM

## 2010-11-14 DIAGNOSIS — Z23 Encounter for immunization: Secondary | ICD-10-CM

## 2010-11-14 DIAGNOSIS — Z113 Encounter for screening for infections with a predominantly sexual mode of transmission: Secondary | ICD-10-CM

## 2010-11-14 NOTE — Assessment & Plan Note (Signed)
She appears to be doing well with the exception of incomplete immune reconstitution. She will continue on atripla. i let her know she can d/c the azithromycin. WIll schedule her for a PAP, mammo and to see dental. She is given condoms. She gets flushot today, PNVX up to date.

## 2010-11-14 NOTE — Assessment & Plan Note (Signed)
This appears to be improving nicely. She will continue to follow up with the health dept.

## 2010-11-14 NOTE — Progress Notes (Signed)
  Subjective:    Patient ID: Melinda Nielsen, female    DOB: 05-Dec-1965, 45 y.o.   MRN: [id number]  HPI 45 yo F with hx of HIV+ since 2006, dx with an ? Illness. She was previously on atripla butwas lost to follow up ~4 years. By her history, she has no history of drug failure. She went to the ED 05-31-10 with 3 weeks of enlarging, painful right neck mass. She was seen at the onset of her illness in urgent care and given penicillin with only transient improvement. She has lost 5# during this period. She had a non-productive cough, no hemoptysis, no chest pain. She has no history of incarceration, homelessness.  In the hospital she was found to have a "right mid to posterior neck mass measuring 3.6 x 4.2 cm with central liquefied appearance. This appears to be due to multiple enlarged lymph nodes. This extends into the right parapharyngeal space" on CT scan. Her CXR showed diffuse bilateral infiltrates. She underwent US guided aspirate (AFB stain negative, fungal stain negative, routine Cx negative). Her pathology showed - "FIBROSIS, HISTIOCYTIC INFILTRATES, INFLAMMATION AND FOCAL NECROSIS. - NO MALIGNANCY IDENTIFIED." Her CD4 was 60, VL 43,300. Genotype background PR mutation (K20R,L33Vs). She was d/c home on bactrim and azithromycin .  She returned to ID clinic 06-08-10 and was restarted on atripla. She has been eval at [facility] for consideration of resection of her scrofulous area but the area broke down and began to drain.She was eval at health dept and was started on anti-TB medications.  CD4 150, VL <20 (10-31-10). F/U CXR (09-01-10) persistent R basilar opacity. Neck has completely healed. Still taking TB medications. She can still feel a small knot at the previous spot, pain better. Has been gaining weight.   Review of Systems  Constitutional: Positive for fatigue. Negative for fever and chills.  Respiratory: Negative for cough and shortness of breath.   Gastrointestinal: Negative for diarrhea and  constipation.  Genitourinary: Negative for dysuria and menstrual problem.       Objective:   Physical Exam  Constitutional: She appears well-developed and well-nourished.  Eyes: EOM are normal. Pupils are equal, round, and reactive to light.  Neck: Neck supple.  Cardiovascular: Normal rate, regular rhythm and normal heart sounds.   Pulmonary/Chest: Effort normal and breath sounds normal.  Abdominal: Soft. Bowel sounds are normal. There is no tenderness.  Lymphadenopathy:    She has no cervical adenopathy.          Assessment & Plan:

## 2010-11-18 ENCOUNTER — Ambulatory Visit: Payer: Self-pay

## 2010-12-02 ENCOUNTER — Ambulatory Visit: Payer: Self-pay

## 2010-12-05 ENCOUNTER — Encounter: Payer: Self-pay | Admitting: *Deleted

## 2010-12-28 ENCOUNTER — Other Ambulatory Visit: Payer: Self-pay | Admitting: Infectious Diseases

## 2010-12-28 ENCOUNTER — Ambulatory Visit
Admission: RE | Admit: 2010-12-28 | Discharge: 2010-12-28 | Disposition: A | Discharge status: Home / Self Care | Payer: No Typology Code available for payment source | Source: Ambulatory Visit | Attending: Infectious Diseases | Admitting: Infectious Diseases

## 2010-12-28 DIAGNOSIS — Z5189 Encounter for other specified aftercare: Secondary | ICD-10-CM

## 2011-01-20 ENCOUNTER — Telehealth: Payer: Self-pay | Admitting: *Deleted

## 2011-01-20 ENCOUNTER — Other Ambulatory Visit: Payer: Self-pay | Admitting: *Deleted

## 2011-01-20 DIAGNOSIS — B20 Human immunodeficiency virus [HIV] disease: Secondary | ICD-10-CM

## 2011-01-20 MED ORDER — EFAVIRENZ-EMTRICITAB-TENOFOVIR 600-200-300 MG PO TABS: 1.0000 | ORAL_TABLET | Freq: Every day | ORAL | Status: DC

## 2011-01-20 MED ORDER — SULFAMETHOXAZOLE-TMP DS 800-160 MG PO TABS: 1.0000 | ORAL_TABLET | Freq: Every day | ORAL | Status: DC

## 2011-01-20 NOTE — Telephone Encounter (Signed)
Called patient in response to the message she left about not having her meds. She stated on the message that she usually has gotten a call by this time of the month and she has not yet. She did not say which meds or call from what agencies. I called the patient and she advised [facility] on [address] is where she needs her meds called in to. Told her i will do this asap.

## 2011-03-07 ENCOUNTER — Telehealth: Payer: Self-pay | Admitting: *Deleted

## 2011-03-07 NOTE — Telephone Encounter (Signed)
Requested that pt call [facility] to make PAP smear appt.

## 2011-03-24 ENCOUNTER — Other Ambulatory Visit: Payer: Self-pay | Admitting: *Deleted

## 2011-03-24 ENCOUNTER — Ambulatory Visit: Payer: No Typology Code available for payment source

## 2011-03-24 DIAGNOSIS — B20 Human immunodeficiency virus [HIV] disease: Secondary | ICD-10-CM

## 2011-03-24 MED ORDER — EFAVIRENZ-EMTRICITAB-TENOFOVIR 600-200-300 MG PO TABS: 1.0000 | ORAL_TABLET | Freq: Every day | ORAL | Status: DC

## 2011-06-21 ENCOUNTER — Encounter: Payer: Self-pay | Admitting: *Deleted

## 2011-07-26 ENCOUNTER — Other Ambulatory Visit: Payer: Self-pay | Admitting: *Deleted

## 2011-07-26 NOTE — Telephone Encounter (Signed)
Phone call to pt to clarify which rxes she needed refilled.  Pt stated that she obtained refills for the medications that she needed from [facility].  Scheduled pt for f/u appts for labs, MD and PAP smear.

## 2011-08-18 ENCOUNTER — Other Ambulatory Visit (INDEPENDENT_AMBULATORY_CARE_PROVIDER_SITE_OTHER): Payer: Self-pay

## 2011-08-18 ENCOUNTER — Other Ambulatory Visit (HOSPITAL_COMMUNITY)
Admission: RE | Admit: 2011-08-18 | Discharge: 2011-08-18 | Disposition: A | Discharge status: Home / Self Care | Payer: Self-pay | Source: Ambulatory Visit | Attending: Internal Medicine | Admitting: Internal Medicine

## 2011-08-18 ENCOUNTER — Ambulatory Visit (INDEPENDENT_AMBULATORY_CARE_PROVIDER_SITE_OTHER): Payer: Self-pay | Admitting: *Deleted

## 2011-08-18 DIAGNOSIS — Z124 Encounter for screening for malignant neoplasm of cervix: Secondary | ICD-10-CM

## 2011-08-18 DIAGNOSIS — Z0000 Encounter for general adult medical examination without abnormal findings: Secondary | ICD-10-CM

## 2011-08-18 DIAGNOSIS — Z1231 Encounter for screening mammogram for malignant neoplasm of breast: Secondary | ICD-10-CM

## 2011-08-18 DIAGNOSIS — Z113 Encounter for screening for infections with a predominantly sexual mode of transmission: Secondary | ICD-10-CM

## 2011-08-18 DIAGNOSIS — Z01419 Encounter for gynecological examination (general) (routine) without abnormal findings: Secondary | ICD-10-CM | POA: Insufficient documentation

## 2011-08-18 DIAGNOSIS — B20 Human immunodeficiency virus [HIV] disease: Secondary | ICD-10-CM

## 2011-08-18 DIAGNOSIS — Z79899 Other long term (current) drug therapy: Secondary | ICD-10-CM

## 2011-08-18 LAB — COMPLETE METABOLIC PANEL WITH GFR
AST: 27 U/L (ref 0–37)
Alkaline Phosphatase: 125 U/L — ABNORMAL HIGH (ref 39–117)
GFR, Est Non African American: >89 mL/min
Glucose, Bld: 97 mg/dL (ref 70–99)
Sodium: 140 mEq/L (ref 135–145)
Total Bilirubin: 0.2 mg/dL — ABNORMAL LOW (ref 0.3–1.2)
Total Protein: 6.8 g/dL (ref 6.0–8.3)

## 2011-08-18 LAB — RPR

## 2011-08-18 LAB — CBC
Hemoglobin: 9.1 g/dL — ABNORMAL LOW (ref 12.0–15.0)
MCH: 22.4 pg — ABNORMAL LOW (ref 26.0–34.0)
MCHC: 29.6 g/dL — ABNORMAL LOW (ref 30.0–36.0)
Platelets: 334 10*3/uL (ref 150–400)
RDW: 19.6 % — ABNORMAL HIGH (ref 11.5–15.5)

## 2011-08-18 LAB — LIPID PANEL
HDL: 44 mg/dL (ref >39)
Total CHOL/HDL Ratio: 3.3 Ratio
VLDL: 11 mg/dL (ref 0–40)

## 2011-08-18 NOTE — Progress Notes (Signed)
  Subjective:     Melinda Nielsen is a 46 y.o. woman who comes in today for a  pap smear only. Contraception: condoms  Objective:    There were no vitals taken for this visit.  LMP 08/14/11 Pelvic Exam:Pap smear obtained.   Assessment:    Screening pap smear.   Plan:    Follow up in one year, or as indicated by Pap results.  Pt was given educational materials re:  BSE, heart disease and HIV, PAP smears, HIV care, ACA, Protecting your partner and self-esteem.  Pt given condoms.

## 2011-08-18 NOTE — Patient Instructions (Signed)
Your results will be ready in about a week.  I will mail them to you.  Thank you for coming to the Center for your care.  Roquel Burgin,  RN 

## 2011-08-23 ENCOUNTER — Other Ambulatory Visit: Payer: Self-pay | Admitting: *Deleted

## 2011-08-23 ENCOUNTER — Encounter: Payer: Self-pay | Admitting: Infectious Diseases

## 2011-08-23 ENCOUNTER — Encounter: Payer: Self-pay | Admitting: *Deleted

## 2011-08-23 ENCOUNTER — Ambulatory Visit (INDEPENDENT_AMBULATORY_CARE_PROVIDER_SITE_OTHER): Payer: Self-pay | Admitting: Infectious Diseases

## 2011-08-23 VITALS — BP 145/89 | HR 92 | Temp 97.5°F | Ht 67.0 in | Wt 189.0 lb

## 2011-08-23 DIAGNOSIS — J329 Chronic sinusitis, unspecified: Secondary | ICD-10-CM | POA: Insufficient documentation

## 2011-08-23 DIAGNOSIS — Z79899 Other long term (current) drug therapy: Secondary | ICD-10-CM

## 2011-08-23 DIAGNOSIS — F17200 Nicotine dependence, unspecified, uncomplicated: Secondary | ICD-10-CM

## 2011-08-23 DIAGNOSIS — R609 Edema, unspecified: Secondary | ICD-10-CM

## 2011-08-23 DIAGNOSIS — D539 Nutritional anemia, unspecified: Secondary | ICD-10-CM

## 2011-08-23 DIAGNOSIS — Z113 Encounter for screening for infections with a predominantly sexual mode of transmission: Secondary | ICD-10-CM

## 2011-08-23 DIAGNOSIS — B20 Human immunodeficiency virus [HIV] disease: Secondary | ICD-10-CM

## 2011-08-23 LAB — HIV-1 RNA QUANT-NO REFLEX-BLD
HIV 1 RNA Quant: <20 copies/mL (ref <20)
HIV-1 RNA Quant, Log: <1.3 {Log} (ref <1.30)

## 2011-08-23 MED ORDER — AZITHROMYCIN 250 MG PO TABS: ORAL_TABLET | ORAL | Status: DC

## 2011-08-23 MED ORDER — AZITHROMYCIN 500 MG PO TABS: 500.0000 mg | ORAL_TABLET | Freq: Every day | ORAL | Status: DC

## 2011-08-23 NOTE — Assessment & Plan Note (Signed)
BP is mildly elevated. Will have her eval in IM clinic. 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

## 2011-08-23 NOTE — Assessment & Plan Note (Signed)
Will give her rx for tri-pack.

## 2011-08-23 NOTE — Progress Notes (Signed)
  Subjective:    Patient ID: Melinda Nielsen, female    DOB: October 26, 1965, 46 y.o.   MRN: [id number]  HPI 46 yo F with hx of HIV+ since 2006. She was previously on atripla butwas lost to follow up ~4 years. By her history, she has no history of drug failure. She went to the ED 05-31-10 with 3 weeks of enlarging, painful right neck mass. She has lost 5# during this period. She had a non-productive cough, no hemoptysis, no chest pain. She has no history of incarceration, homelessness.  In the hospital she was found to have a "right mid to posterior neck mass measuring 3.6 x 4.2 cm with central liquefied appearance. This appears to be due to multiple enlarged lymph nodes. This extends into the right parapharyngeal space" on CT scan. Her CXR showed diffuse bilateral infiltrates. She underwent US guided aspirate (AFB stain negative, fungal stain negative, routine Cx negative). Her pathology showed - "FIBROSIS, HISTIOCYTIC INFILTRATES, INFLAMMATION AND FOCAL NECROSIS. - NO MALIGNANCY IDENTIFIED." Her CD4 was 60, VL 43,300. Genotype background PR mutation (K20R,L33Vs). She was d/c home on bactrim and azithromycin .  She returned to ID clinic 06-08-10 and was restarted on atripla. She has been eval at [facility] for consideration of resection of her scrofulous area but the area broke down and began to drain.She was eval at health dept and was started on anti-TB medications. She completed these on December 2012.  Has gained weight since previous visit. Has had some problems with fluid retention in feet, joints. Feels like she got a "bug" while in waiting room with her son in the hospital/ED. No f/c. Sinus pressure, rhinorrhea. No cough.   Neck swelling resolved.  Has noted some small skin lesions with her cycle.   HIV 1 RNA Quant (copies/mL)  Date Value  10/31/2010 <20   07/27/2010 87*  06/02/2010 43300*     CD4 T Cell Abs (cmm)  Date Value  08/18/2011 220*  10/31/2010 150*  07/27/2010 270*      Review of  Systems     Objective:   Physical Exam  Constitutional: She appears well-developed and well-nourished.  HENT:  Mouth/Throat: Abnormal dentition. Dental caries present. No oropharyngeal exudate.  Eyes: EOM are normal. Pupils are equal, round, and reactive to light.  Neck: Neck supple.  Cardiovascular: Normal rate, regular rhythm and normal heart sounds.   Pulmonary/Chest: Effort normal and breath sounds normal.  Abdominal: Soft. Bowel sounds are normal. She exhibits no distension. There is no tenderness.  Lymphadenopathy:    She has no cervical adenopathy.          Assessment & Plan:

## 2011-08-23 NOTE — Assessment & Plan Note (Signed)
Will resume her Iron (she is currently off). Consider EGD.Marland KitchenMarland Kitchen

## 2011-08-23 NOTE — Assessment & Plan Note (Signed)
Encouraged to quit. Difficult due to her "stress level"

## 2011-08-23 NOTE — Assessment & Plan Note (Addendum)
She is doing ok. CD4 is stable. She has condoms from her previous PAP visit. Prev Hep B SAb +. rtc 6 months.

## 2011-10-10 ENCOUNTER — Ambulatory Visit (HOSPITAL_COMMUNITY): Admission: RE | Admit: 2011-10-10 | Payer: Self-pay | Source: Ambulatory Visit

## 2011-11-02 ENCOUNTER — Ambulatory Visit: Payer: Self-pay

## 2011-11-07 ENCOUNTER — Ambulatory Visit (HOSPITAL_COMMUNITY): Payer: Self-pay | Attending: Infectious Diseases

## 2012-02-27 NOTE — Progress Notes (Signed)
This encounter was created in error - please disregard.

## 2012-04-29 ENCOUNTER — Ambulatory Visit: Payer: Self-pay

## 2012-07-10 ENCOUNTER — Telehealth: Payer: Self-pay | Admitting: *Deleted

## 2012-07-10 DIAGNOSIS — B20 Human immunodeficiency virus [HIV] disease: Secondary | ICD-10-CM

## 2012-07-10 MED ORDER — SULFAMETHOXAZOLE-TMP DS 800-160 MG PO TABS: 1.0000 | ORAL_TABLET | Freq: Every day | ORAL | Status: DC

## 2012-07-10 MED ORDER — EFAVIRENZ-EMTRICITAB-TENOFOVIR 600-200-300 MG PO TABS: 1.0000 | ORAL_TABLET | Freq: Every day | ORAL | Status: DC

## 2012-07-10 NOTE — Telephone Encounter (Signed)
Phone call to [facility] to ascertain pattern of pt refilling Atripla.  Pt started refilling Atripla @ [facility] 12/11/11 (#30), 01/12/12 (#30), 02/19/12 (#30), 03/17/12 (#30), 06/03/12 (#30).  Pt is currently out of refills.  Last OV w/ Dr. Ninetta Lights 08/23/11.  [facility] given 1 refill for Atripla and Septra DS with a note to the pt to call [facility] for lab work and MD appts for further refills.

## 2012-07-22 ENCOUNTER — Other Ambulatory Visit: Payer: Self-pay | Admitting: Licensed Clinical Social Worker

## 2012-07-22 DIAGNOSIS — B20 Human immunodeficiency virus [HIV] disease: Secondary | ICD-10-CM

## 2012-07-22 NOTE — Telephone Encounter (Signed)
Refill request faxed from pharmacy for Atripla and Bactrim, denied because patient hasn't kept appointment.

## 2012-08-01 ENCOUNTER — Ambulatory Visit: Payer: Self-pay

## 2012-08-08 ENCOUNTER — Other Ambulatory Visit: Payer: Self-pay | Admitting: *Deleted

## 2012-08-08 DIAGNOSIS — B20 Human immunodeficiency virus [HIV] disease: Secondary | ICD-10-CM

## 2012-08-08 MED ORDER — SULFAMETHOXAZOLE-TMP DS 800-160 MG PO TABS: 1.0000 | ORAL_TABLET | Freq: Every day | ORAL | Status: DC

## 2012-08-08 MED ORDER — EFAVIRENZ-EMTRICITAB-TENOFOVIR 600-200-300 MG PO TABS: 1.0000 | ORAL_TABLET | Freq: Every day | ORAL | Status: DC

## 2012-08-13 ENCOUNTER — Other Ambulatory Visit (INDEPENDENT_AMBULATORY_CARE_PROVIDER_SITE_OTHER): Payer: Self-pay

## 2012-08-13 DIAGNOSIS — Z79899 Other long term (current) drug therapy: Secondary | ICD-10-CM

## 2012-08-13 DIAGNOSIS — B20 Human immunodeficiency virus [HIV] disease: Secondary | ICD-10-CM

## 2012-08-13 DIAGNOSIS — Z113 Encounter for screening for infections with a predominantly sexual mode of transmission: Secondary | ICD-10-CM

## 2012-08-13 LAB — CBC
HCT: 30.9 % — ABNORMAL LOW (ref 36.0–46.0)
MCH: 20.1 pg — ABNORMAL LOW (ref 26.0–34.0)
MCV: 66.2 fL — ABNORMAL LOW (ref 78.0–100.0)
Platelets: 403 10*3/uL — ABNORMAL HIGH (ref 150–400)
RBC: 4.67 MIL/uL (ref 3.87–5.11)
WBC: 4.8 10*3/uL (ref 4.0–10.5)

## 2012-08-13 LAB — COMPREHENSIVE METABOLIC PANEL
BUN: 13 mg/dL (ref 6–23)
CO2: 20 mEq/L (ref 19–32)
Calcium: 8.9 mg/dL (ref 8.4–10.5)
Chloride: 105 mEq/L (ref 96–112)
Creat: 0.7 mg/dL (ref 0.50–1.10)
Glucose, Bld: 115 mg/dL — ABNORMAL HIGH (ref 70–99)
Total Bilirubin: 0.2 mg/dL — ABNORMAL LOW (ref 0.3–1.2)

## 2012-08-13 LAB — LIPID PANEL
Cholesterol: 162 mg/dL (ref 0–200)
HDL: 47 mg/dL (ref >39)
Total CHOL/HDL Ratio: 3.4 Ratio
Triglycerides: 63 mg/dL (ref <150)
VLDL: 13 mg/dL (ref 0–40)

## 2012-08-14 LAB — T-HELPER CELL (CD4) - (RCID CLINIC ONLY): CD4 T Cell Abs: 250 uL — ABNORMAL LOW (ref 400–2700)

## 2012-08-26 ENCOUNTER — Ambulatory Visit (INDEPENDENT_AMBULATORY_CARE_PROVIDER_SITE_OTHER): Payer: Self-pay | Admitting: Infectious Diseases

## 2012-08-26 ENCOUNTER — Encounter: Payer: Self-pay | Admitting: Infectious Diseases

## 2012-08-26 VITALS — BP 147/98 | HR 101 | Temp 97.9°F | Ht 67.0 in | Wt 210.0 lb

## 2012-08-26 DIAGNOSIS — B20 Human immunodeficiency virus [HIV] disease: Secondary | ICD-10-CM

## 2012-08-26 DIAGNOSIS — A311 Cutaneous mycobacterial infection: Secondary | ICD-10-CM

## 2012-08-26 NOTE — Assessment & Plan Note (Signed)
resolved 

## 2012-08-26 NOTE — Progress Notes (Signed)
  Subjective:    Patient ID: Melinda Nielsen, female    DOB: [birth year], 47 y.o.   MRN: [id number]  HPI  47 yo F with hx of HIV+ since 2006. She was previously on [place] but was lost to follow up ~4 years. By her history, she has no history of drug failure. She went to the ED 05-31-10 with 3 weeks of enlarging, painful right neck mass. In the hospital she was found to have a "right mid to posterior neck mass measuring 3.6 x 4.2 cm with central liquefied appearance. This appears to be due to multiple enlarged lymph nodes. This extends into the right parapharyngeal space" on CT scan. Her CXR showed diffuse bilateral infiltrates. She underwent US guided aspirate (AFB stain negative, fungal stain negative, routine Cx negative). Her pathology showed - "FIBROSIS, HISTIOCYTIC INFILTRATES, INFLAMMATION AND FOCAL NECROSIS. - NO MALIGNANCY IDENTIFIED." Her CD4 was 60, VL 43,300. Genotype background PR mutation (K20R,L33Vs). She returned to ID clinic 06-08-10 and was restarted on atripla. She has been eval at [facility] for consideration of resection of her scrofulous area but the area broke down and began to drain.She was eval at health dept and was started on anti-TB medications. She completed these on December 2012.   HIV 1 RNA Quant (copies/mL)  Date Value  08/13/2012 20   08/18/2011 <20   10/31/2010 <20      CD4 T Cell Abs (cmm)  Date Value  08/13/2012 250*  08/18/2011 220*  10/31/2010 150*   No problems with her neck: wound closed, no induration, swelling.  No problems with atripla.  Has been gaining wt.     Review of Systems  Constitutional: Negative for appetite change and unexpected weight change.  Gastrointestinal: Negative for diarrhea and constipation.  Genitourinary: Negative for dysuria and menstrual problem.       Objective:   Physical Exam  Constitutional: She appears well-developed and well-nourished.  HENT:  Mouth/Throat: No oropharyngeal exudate.  Eyes: EOM are normal. Pupils are  equal, round, and reactive to light.  Neck: Neck supple.  Cardiovascular: Normal rate, regular rhythm and normal heart sounds.   Pulmonary/Chest: Effort normal and breath sounds normal.  Abdominal: Soft. Bowel sounds are normal. There is no tenderness.  Lymphadenopathy:    She has no cervical adenopathy.          Assessment & Plan:

## 2012-08-26 NOTE — Assessment & Plan Note (Signed)
She is doing well. Will continue her current meds. She asks about staying on bactrim, i advised her that this could be doing her more harm than good. She agrees to stop this medication. Will set her up for dental, pap. See her back in 6 months

## 2012-09-05 ENCOUNTER — Other Ambulatory Visit: Payer: Self-pay | Admitting: Licensed Clinical Social Worker

## 2012-09-05 DIAGNOSIS — B20 Human immunodeficiency virus [HIV] disease: Secondary | ICD-10-CM

## 2012-09-05 MED ORDER — EFAVIRENZ-EMTRICITAB-TENOFOVIR 600-200-300 MG PO TABS: 1.0000 | ORAL_TABLET | Freq: Every day | ORAL | Status: DC

## 2012-10-02 IMAGING — CT CT NECK W/ CM
3 series · 13 of 20 positions shown, 15 images · IV contrast (agent unspecified)
Comparison: None

CLINICAL DATA: Right neck swelling.  Sore throat.  HIV

CT NECK WITH CONTRAST
TECHNIQUE: Multidetector CT imaging of the neck was performed with
intravenous contrast.
Contrast: 100 ml [id number]-7RR IV

[Series 3: neck rtn st · axial · 0.39mm/px · z∈[+984,+1098]mm · 3 of 78 slices shown]
[im 20/78  bone]
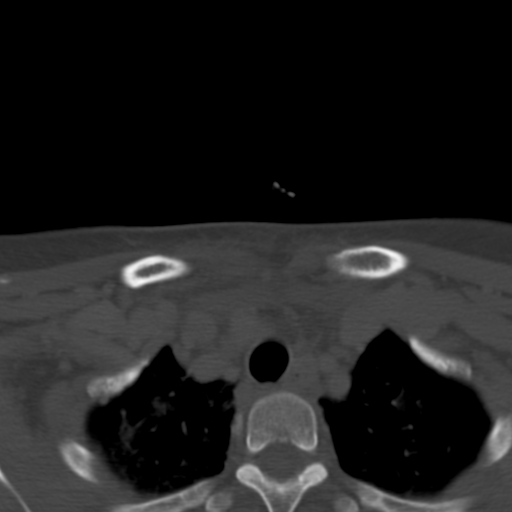
[im 39/78  bone]
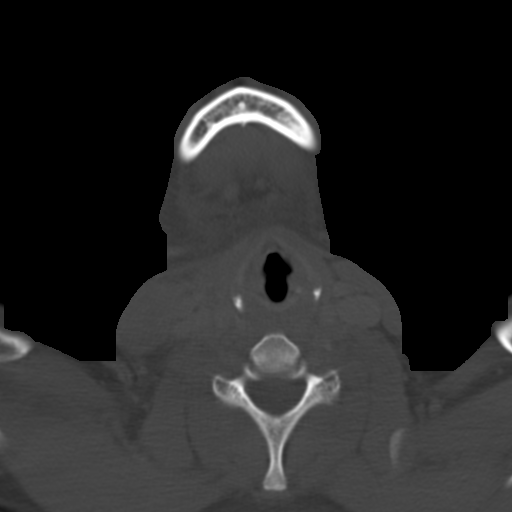
[im 58/78  bone]
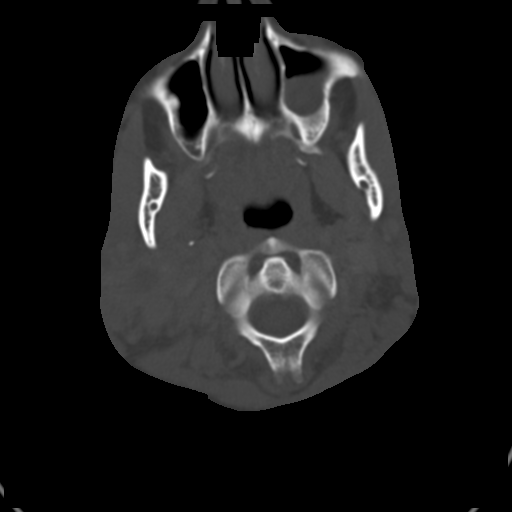

[Series 602: <mpr range> · axial · 0.46mm/px · z∈[+921,+1100]mm · 7 of 124 slices shown, 9 images]
[im 16/124  soft-tissue]
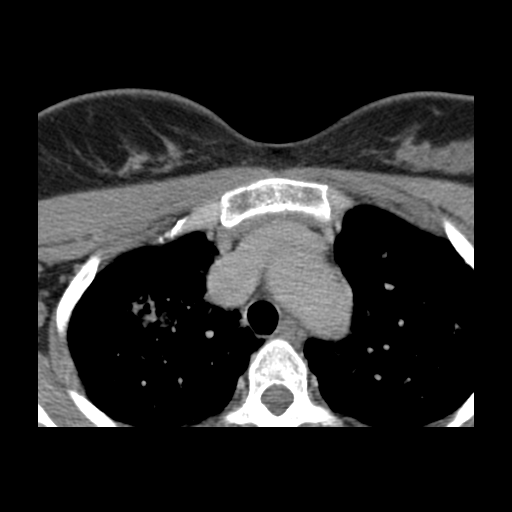
[im 16/124  bone]
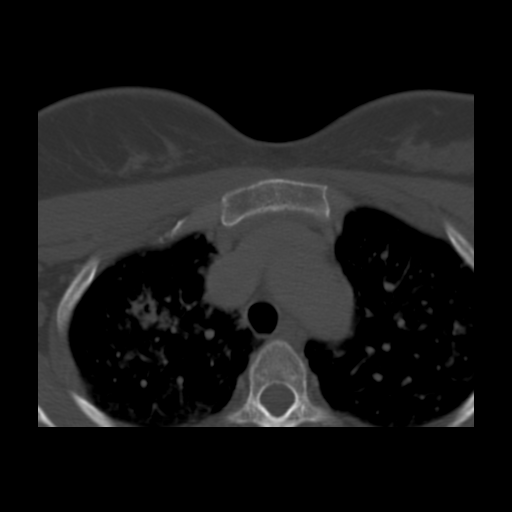
[im 31/124  bone]
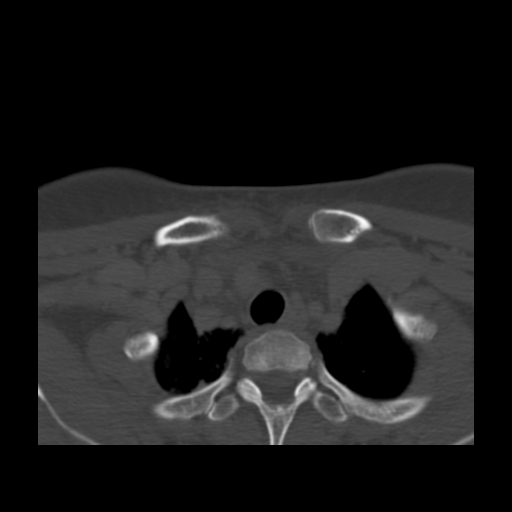
[im 47/124  bone]
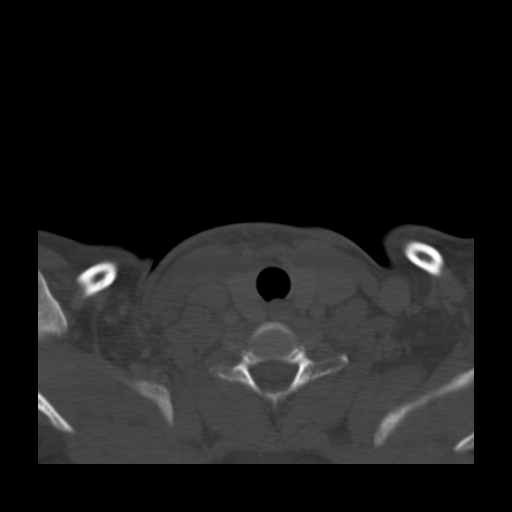
[im 62/124  bone]
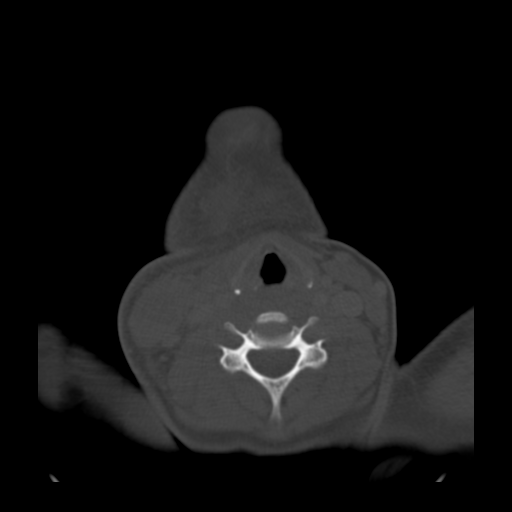
[im 77/124  soft-tissue]
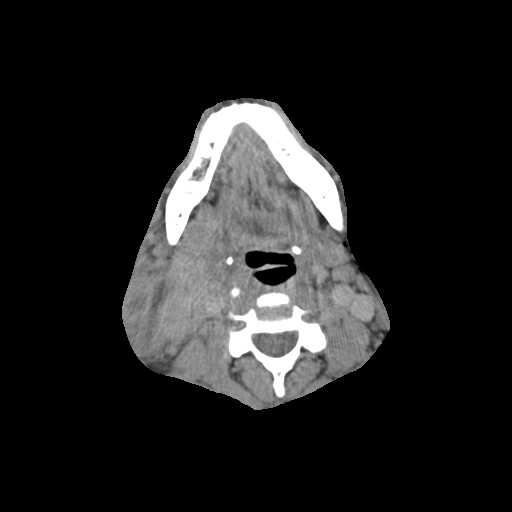
[im 77/124  bone]
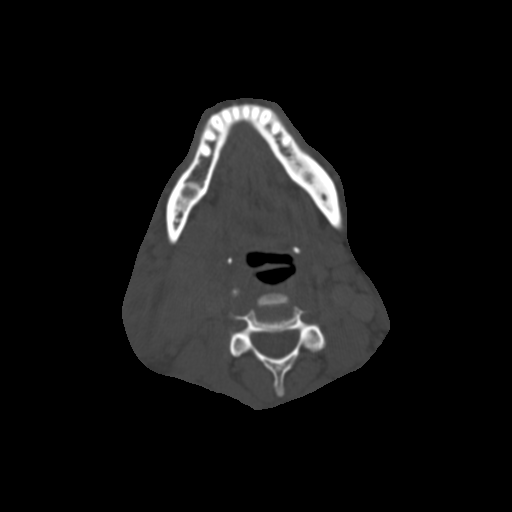
[im 93/124  bone]
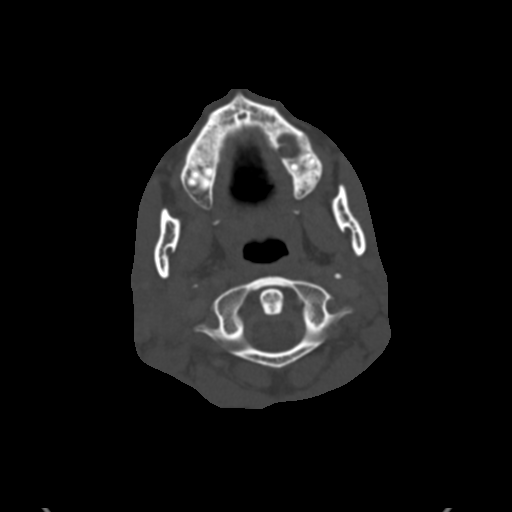
[im 108/124  bone]
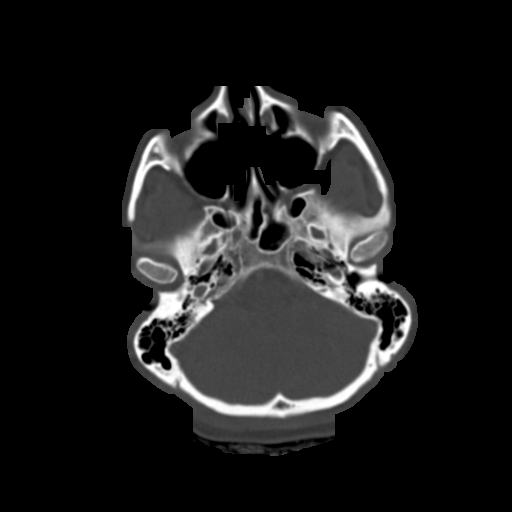

[Series 603: <mpr range(1)> · coronal · 0.46mm/px · 3 of 75 slices shown]
[im 16/75  bone]
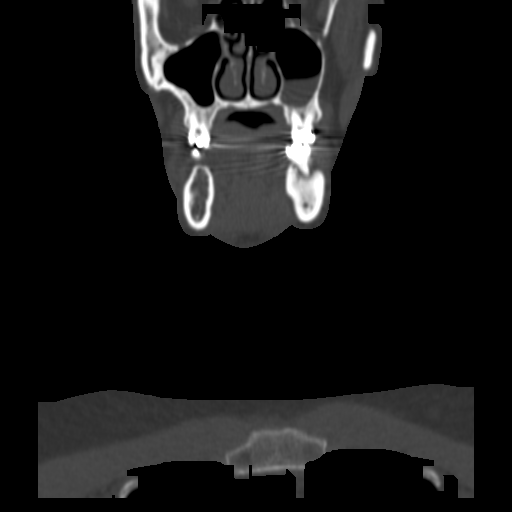
[im 30/75  bone]
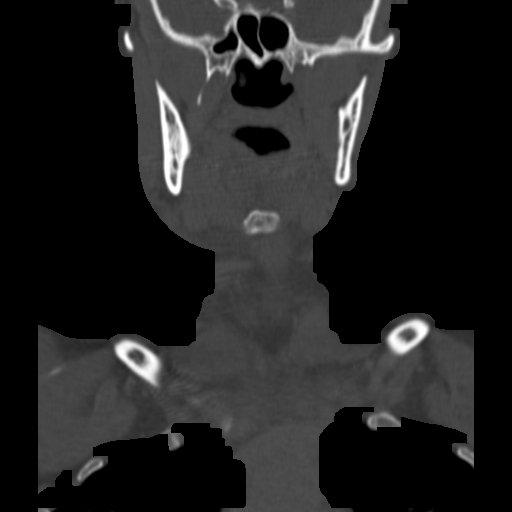
[im 59/75  bone]
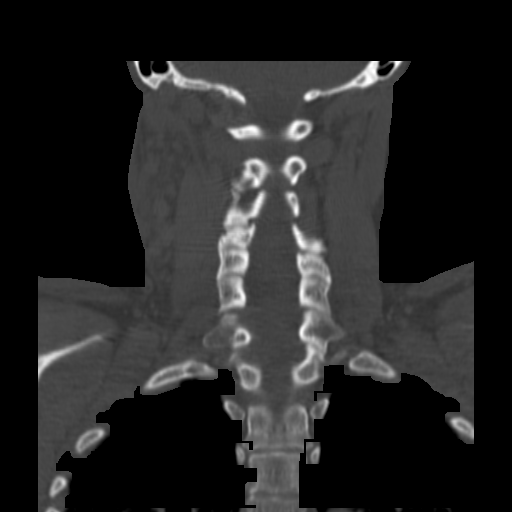

[13 of 20 positions shown; findings below may reference images not displayed]

FINDINGS: Mass in the right neck posterior to the angle of the
mandible.  This measures approximately 3.6 x 4.2 cm and has a
lobular contour and  appears to be a group of enlarged lymph nodes.
There is a liquefied center measuring 17 mm. The mass extends into
the parapharyngeal space.  Left level II lymph node measures 12 mm.
No lymph node mass on the left.

Submandibular and parotid gland is normal bilaterally.  No acute
abnormality in the spine.

Imaging through the lung apices reveals diffuse patchy infiltrate
with a small nodular component.  The lungs were clear on chest x-
ray of 06/27/2007. Chronic sinusitis left maxillary sinus.
IMPRESSION: Right mid to  posterior neck mass measuring 3.6 x 4.2 cm with
central liquefied appearance.  This appears to be due to multiple
enlarged   lymph nodes.  This extends into the right parapharyngeal
space.  This could be due to infection or neoplasm.  Given the
infiltrates  in the upper lobes and HIV status, the findings are
most compatible with tuberculous pneumonia and tuberculous adenitis
in the neck (scrofula).

## 2012-10-02 IMAGING — CR DG CHEST 2V
2 series · 2 of 2 positions shown · non-contrast
Comparison: 06/27/2007

CLINICAL DATA: Sore throat and swelling.  HIV

CHEST - 2 VIEW

[w chest pa]
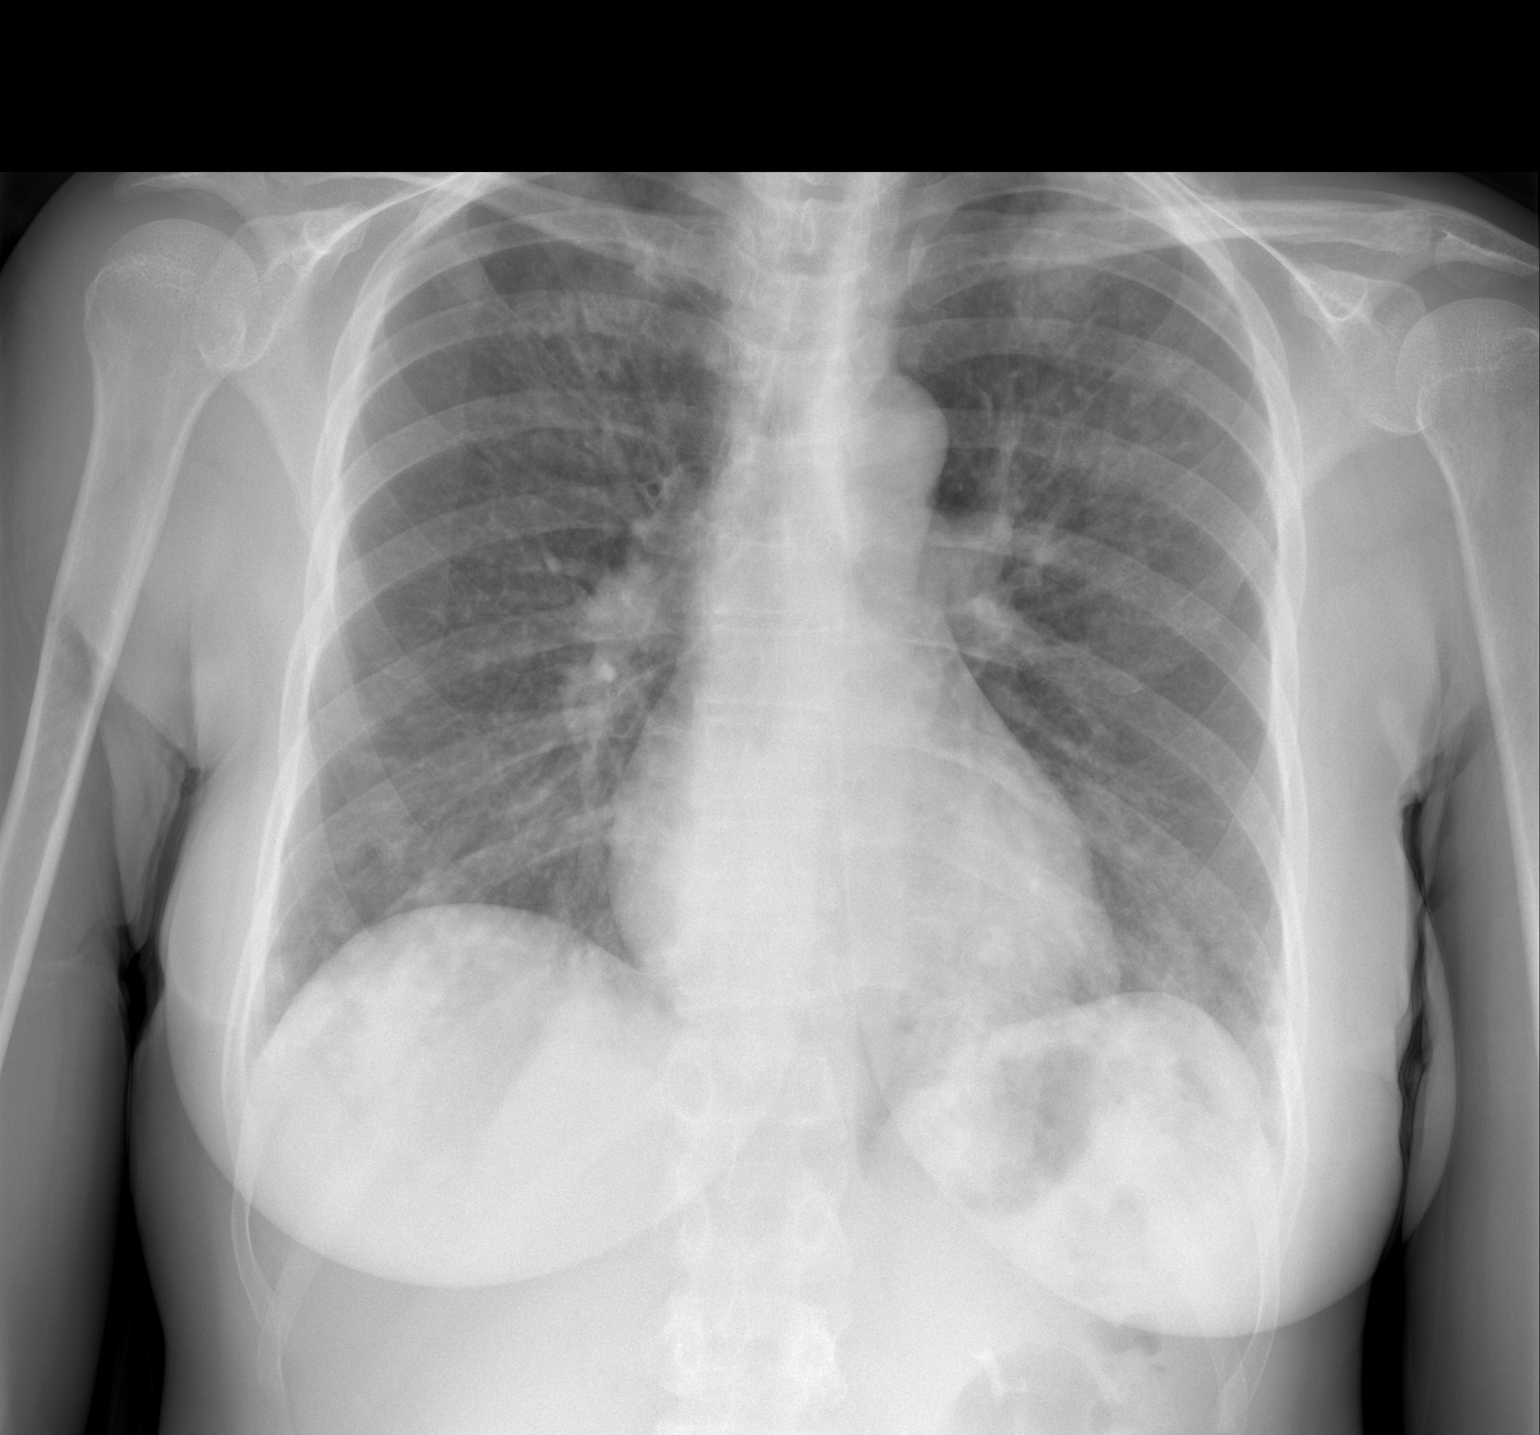

[w chest lat]
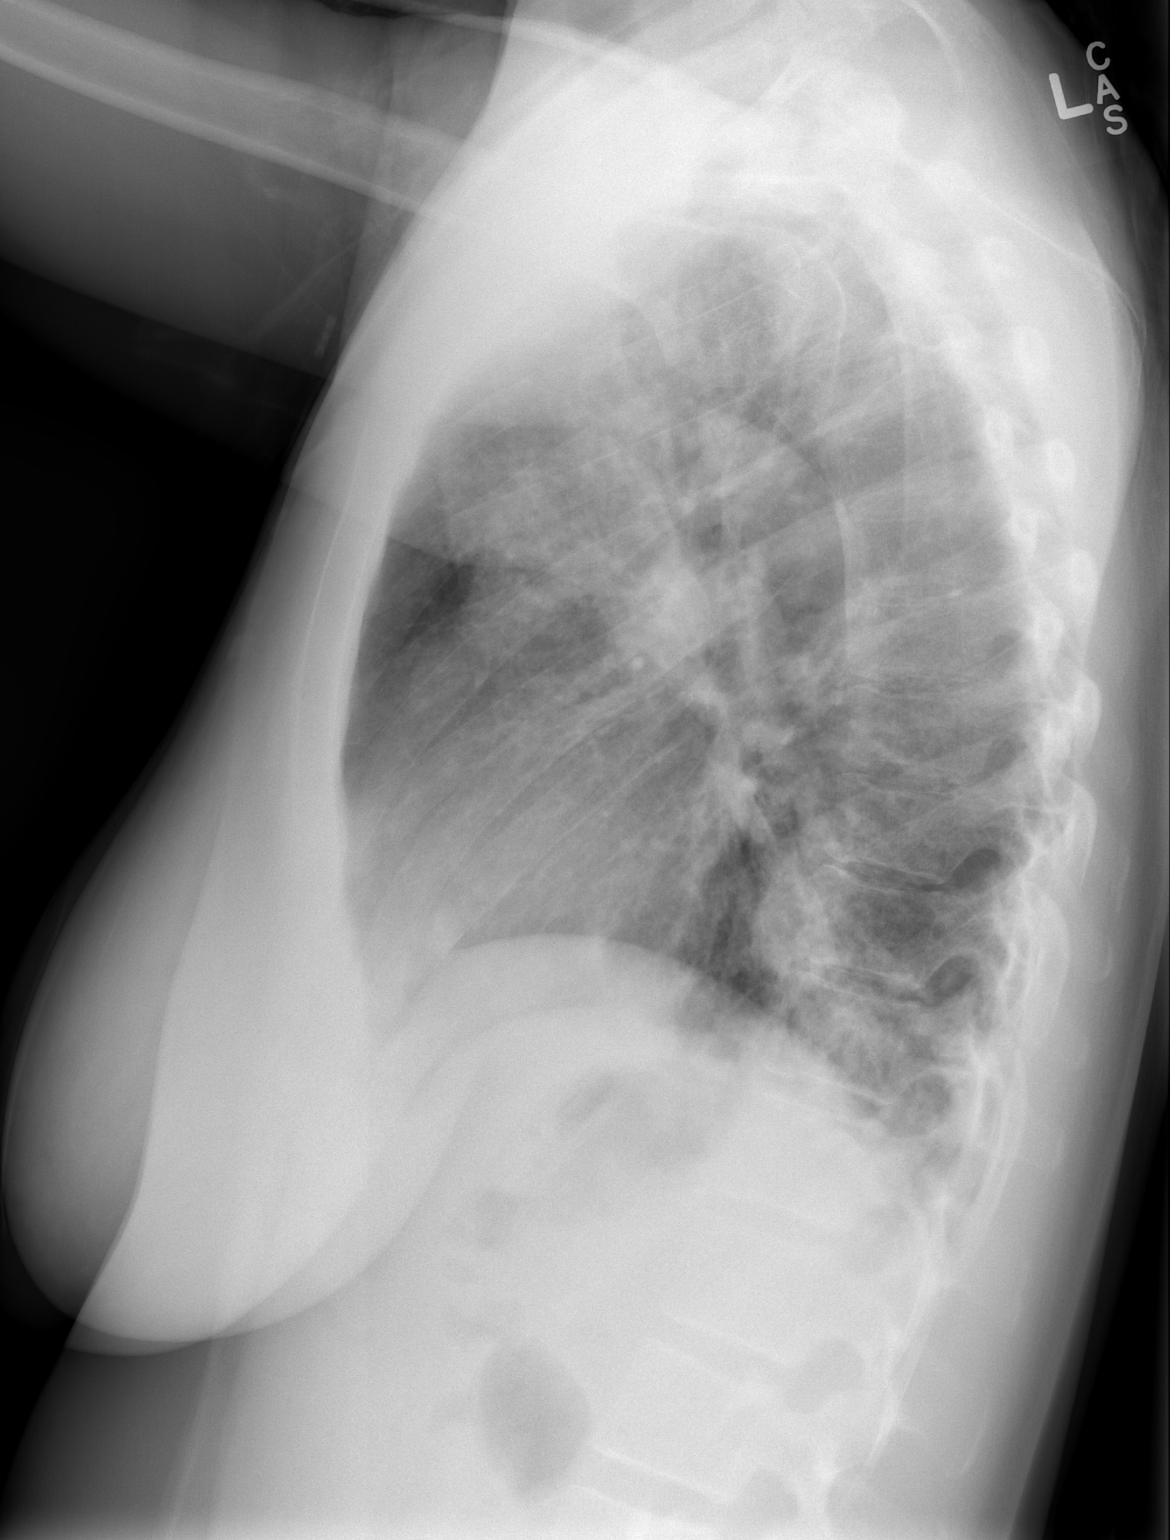

[2 of 2 positions shown; findings below may reference images not displayed]

FINDINGS: Patchy diffuse infiltrates in the upper and lower lobes
bilaterally.  The lungs were clear and normal appearing previously.
No significant pleural effusion.  No adenopathy.  Heart size is
normal.
IMPRESSION: Diffuse bilateral infiltrates.  This may be pneumonia.  Tuberculous
pneumonia is likely given the right neck mass and HIV history.

## 2012-12-03 ENCOUNTER — Telehealth: Payer: Self-pay | Admitting: *Deleted

## 2012-12-03 NOTE — Telephone Encounter (Signed)
LM requesting pt call for PAP smear appt. 

## 2013-01-09 ENCOUNTER — Other Ambulatory Visit: Payer: Self-pay | Admitting: Infectious Diseases

## 2013-02-04 ENCOUNTER — Ambulatory Visit: Payer: Self-pay

## 2013-02-05 ENCOUNTER — Ambulatory Visit: Payer: Self-pay

## 2013-02-07 ENCOUNTER — Other Ambulatory Visit: Payer: Self-pay | Admitting: Infectious Diseases

## 2013-03-08 ENCOUNTER — Other Ambulatory Visit: Payer: Self-pay | Admitting: Infectious Diseases

## 2013-03-10 ENCOUNTER — Ambulatory Visit: Payer: Self-pay

## 2013-03-10 ENCOUNTER — Encounter: Payer: Self-pay | Admitting: *Deleted

## 2013-03-18 ENCOUNTER — Other Ambulatory Visit: Payer: Self-pay | Admitting: *Deleted

## 2013-03-18 DIAGNOSIS — B20 Human immunodeficiency virus [HIV] disease: Secondary | ICD-10-CM

## 2013-03-18 MED ORDER — EFAVIRENZ-EMTRICITAB-TENOFOVIR 600-200-300 MG PO TABS
ORAL_TABLET | ORAL | Status: DC
Start: 2013-03-18 — End: 2013-04-14

## 2013-03-18 NOTE — Progress Notes (Signed)
ADAP application 

## 2013-04-11 ENCOUNTER — Ambulatory Visit (INDEPENDENT_AMBULATORY_CARE_PROVIDER_SITE_OTHER): Payer: Self-pay | Admitting: Infectious Diseases

## 2013-04-11 ENCOUNTER — Encounter: Payer: Self-pay | Admitting: Infectious Diseases

## 2013-04-11 ENCOUNTER — Other Ambulatory Visit: Payer: Self-pay | Admitting: Infectious Diseases

## 2013-04-11 ENCOUNTER — Ambulatory Visit (INDEPENDENT_AMBULATORY_CARE_PROVIDER_SITE_OTHER): Payer: Self-pay | Admitting: *Deleted

## 2013-04-11 VITALS — BP 155/95 | HR 108 | Temp 98.1°F | Ht 67.0 in | Wt 213.0 lb

## 2013-04-11 DIAGNOSIS — R739 Hyperglycemia, unspecified: Secondary | ICD-10-CM

## 2013-04-11 DIAGNOSIS — Z124 Encounter for screening for malignant neoplasm of cervix: Secondary | ICD-10-CM

## 2013-04-11 DIAGNOSIS — F17200 Nicotine dependence, unspecified, uncomplicated: Secondary | ICD-10-CM

## 2013-04-11 DIAGNOSIS — B20 Human immunodeficiency virus [HIV] disease: Secondary | ICD-10-CM

## 2013-04-11 DIAGNOSIS — Z1231 Encounter for screening mammogram for malignant neoplasm of breast: Secondary | ICD-10-CM

## 2013-04-11 DIAGNOSIS — R7309 Other abnormal glucose: Secondary | ICD-10-CM

## 2013-04-11 DIAGNOSIS — Z113 Encounter for screening for infections with a predominantly sexual mode of transmission: Secondary | ICD-10-CM

## 2013-04-11 DIAGNOSIS — Z23 Encounter for immunization: Secondary | ICD-10-CM

## 2013-04-11 DIAGNOSIS — Z79899 Other long term (current) drug therapy: Secondary | ICD-10-CM

## 2013-04-11 DIAGNOSIS — I10 Essential (primary) hypertension: Secondary | ICD-10-CM | POA: Insufficient documentation

## 2013-04-11 LAB — LIPID PANEL
Cholesterol: 153 mg/dL (ref 0–200)
HDL: 43 mg/dL (ref >39)
LDL CALC: 94 mg/dL (ref 0–99)
TRIGLYCERIDES: 79 mg/dL (ref <150)
Total CHOL/HDL Ratio: 3.6 Ratio
VLDL: 16 mg/dL (ref 0–40)

## 2013-04-11 LAB — COMPREHENSIVE METABOLIC PANEL
ALK PHOS: 129 U/L — AB (ref 39–117)
ALT: 12 U/L (ref 0–35)
AST: 21 U/L (ref 0–37)
Albumin: 4 g/dL (ref 3.5–5.2)
BUN: 10 mg/dL (ref 6–23)
CALCIUM: 8.8 mg/dL (ref 8.4–10.5)
CO2: 25 mEq/L (ref 19–32)
CREATININE: 0.61 mg/dL (ref 0.50–1.10)
Chloride: 106 mEq/L (ref 96–112)
GLUCOSE: 96 mg/dL (ref 70–99)
POTASSIUM: 4.3 meq/L (ref 3.5–5.3)
Sodium: 139 mEq/L (ref 135–145)
Total Bilirubin: 0.2 mg/dL (ref 0.2–1.2)
Total Protein: 7.1 g/dL (ref 6.0–8.3)

## 2013-04-11 LAB — CBC WITH DIFFERENTIAL/PLATELET
Basophils Absolute: 0.1 K/uL (ref 0.0–0.1)
Basophils Relative: 2 % — ABNORMAL HIGH (ref 0–1)
Eosinophils Absolute: 0.1 K/uL (ref 0.0–0.7)
Eosinophils Relative: 3 % (ref 0–5)
HCT: 28.2 % — ABNORMAL LOW (ref 36.0–46.0)
Hemoglobin: 8.3 g/dL — ABNORMAL LOW (ref 12.0–15.0)
Lymphocytes Relative: 40 % (ref 12–46)
Lymphs Abs: 1.8 K/uL (ref 0.7–4.0)
MCH: 19.5 pg — ABNORMAL LOW (ref 26.0–34.0)
MCHC: 29.4 g/dL — ABNORMAL LOW (ref 30.0–36.0)
MCV: 66.4 fL — ABNORMAL LOW (ref 78.0–100.0)
Monocytes Absolute: 0.4 K/uL (ref 0.1–1.0)
Monocytes Relative: 8 % (ref 3–12)
Neutro Abs: 2.1 K/uL (ref 1.7–7.7)
Neutrophils Relative %: 47 % (ref 43–77)
Platelets: 582 K/uL — ABNORMAL HIGH (ref 150–400)
RBC: 4.25 MIL/uL (ref 3.87–5.11)
RDW: 21 % — ABNORMAL HIGH (ref 11.5–15.5)
WBC: 4.4 K/uL (ref 4.0–10.5)

## 2013-04-11 LAB — T-HELPER CELL (CD4) - (RCID CLINIC ONLY)
CD4 T CELL HELPER: 15 % — AB (ref 33–55)
CD4 T Cell Abs: 260 /uL — ABNORMAL LOW (ref 400–2700)

## 2013-04-11 LAB — RPR

## 2013-04-11 NOTE — Progress Notes (Signed)
   Subjective:    Patient ID: Melinda Nielsen, female    DOB: [birth year], 48 y.o.   MRN: [id number]  HPI 48 yo F with hx of HIV+ since 2006. She was previously on [place] but was lost to follow up ~4 years.  She went to the ED 05-31-10 with 3 weeks of enlarging, painful right neck mass. In the hospital she was found to have a "right mid to posterior neck mass measuring 3.6 x 4.2 cm with central liquefied appearance. This appears to be due to multiple enlarged lymph nodes. This extends into the right parapharyngeal space" on CT scan. Her CXR showed diffuse bilateral infiltrates. She underwent US guided aspirate (AFB stain negative, fungal stain negative, routine Cx negative). Her pathology showed - "FIBROSIS, HISTIOCYTIC INFILTRATES, INFLAMMATION AND FOCAL NECROSIS. - NO MALIGNANCY IDENTIFIED." Her CD4 was 60, VL 43,300. Genotype background PR mutation (K20R,L33Vs).  She returned to ID clinic 06-08-10 and was restarted on atripla. She has been eval at [facility]NT for consideration of resection of her scrofulous area but the area broke down and began to drain.She was eval at health dept and was started on anti-TB medications. She completed these on December 2012.  She continues to take [place]. No problems. "Getting too fat". Trying to exercise more. Has been trying to gain wt previously.  No further issues with her neck.  To have PAP today.  HIV 1 RNA Quant (copies/mL)  Date Value  08/13/2012 20   08/18/2011 <20   10/31/2010 <20      CD4 T Cell Abs (cmm)  Date Value  08/13/2012 250*  08/18/2011 220*  10/31/2010 150*    Review of Systems  Constitutional: Negative for fever, chills, appetite change and unexpected weight change.  Respiratory: Negative for shortness of breath.   Cardiovascular: Negative for chest pain.  Gastrointestinal: Negative for diarrhea and constipation.  Genitourinary: Negative for difficulty urinating.  Neurological: Negative for numbness and headaches.       Objective:   Physical Exam  Constitutional: She appears well-developed and well-nourished.  HENT:  Mouth/Throat: No oropharyngeal exudate.  Eyes: EOM are normal. Pupils are equal, round, and reactive to light.  Neck: Neck supple.  Cardiovascular: Normal rate, regular rhythm and normal heart sounds.   Pulmonary/Chest: Effort normal and breath sounds normal.  Abdominal: Soft. Bowel sounds are normal. She exhibits no distension. There is no tenderness.  Lymphadenopathy:    She has no cervical adenopathy.          Assessment & Plan:

## 2013-04-11 NOTE — Patient Instructions (Signed)
Will mail pt results.

## 2013-04-11 NOTE — Addendum Note (Signed)
Addended by: Myrtis Hopping A on: 04/11/2013 12:08 PM   Modules accepted: Orders

## 2013-04-11 NOTE — Assessment & Plan Note (Signed)
States she always has increased BP here. Gets it checked in community and is normal. Will continue to monitor. She is going to start exercising and watching diet.

## 2013-04-11 NOTE — Assessment & Plan Note (Signed)
encrouaged to quit

## 2013-04-11 NOTE — Assessment & Plan Note (Signed)
Her prev Glc was elevated. Will check her A1C. Encourage diet and exercise.

## 2013-04-11 NOTE — Assessment & Plan Note (Signed)
She is doing well. She gets condoms today. Will get Hep B vax today. She will get PAP today. She gets labs today. Will see her back in 6 months. Encouraged exercise and diet.

## 2013-04-11 NOTE — Progress Notes (Signed)
  Subjective:     Melinda Nielsen is a 48 y.o. woman who comes in today for a  pap smear only.  Previous abnormal Pap smears: no. Contraception: condoms  Objective:   LMP 03/31/13 Pelvic Exam: Pap smear obtained.   Assessment:    Screening pap smear.   Plan:    Follow up in one year, or as indicated by Pap results.  Pt given educational materials re: HIV and women, self-esteem, BSE, nutrition and diet management, PAP smears and partner safety. Pt given condoms.

## 2013-04-12 ENCOUNTER — Other Ambulatory Visit: Payer: Self-pay | Admitting: Infectious Diseases

## 2013-04-12 LAB — HEMOGLOBIN A1C
HEMOGLOBIN A1C: 6.2 % — AB (ref <5.7)
MEAN PLASMA GLUCOSE: 131 mg/dL — AB (ref <117)

## 2013-04-13 LAB — HIV-1 RNA QUANT-NO REFLEX-BLD: HIV-1 RNA Quant, Log: <1.3 {Log} (ref <1.30)

## 2013-04-14 LAB — URINE CYTOLOGY ANCILLARY ONLY
Chlamydia: NEGATIVE
Neisseria Gonorrhea: NEGATIVE

## 2013-04-16 ENCOUNTER — Telehealth: Payer: Self-pay | Admitting: *Deleted

## 2013-04-16 NOTE — Telephone Encounter (Signed)
Left message.  PAP smear is normal

## 2013-05-02 IMAGING — CR DG CHEST 1V
1 series · 1 of 1 positions shown · non-contrast
Comparison: Chest x-ray of 08/22/2010

CLINICAL DATA: Completion of treatment for tuberculosis

CHEST - 1 VIEW

[view not recorded]
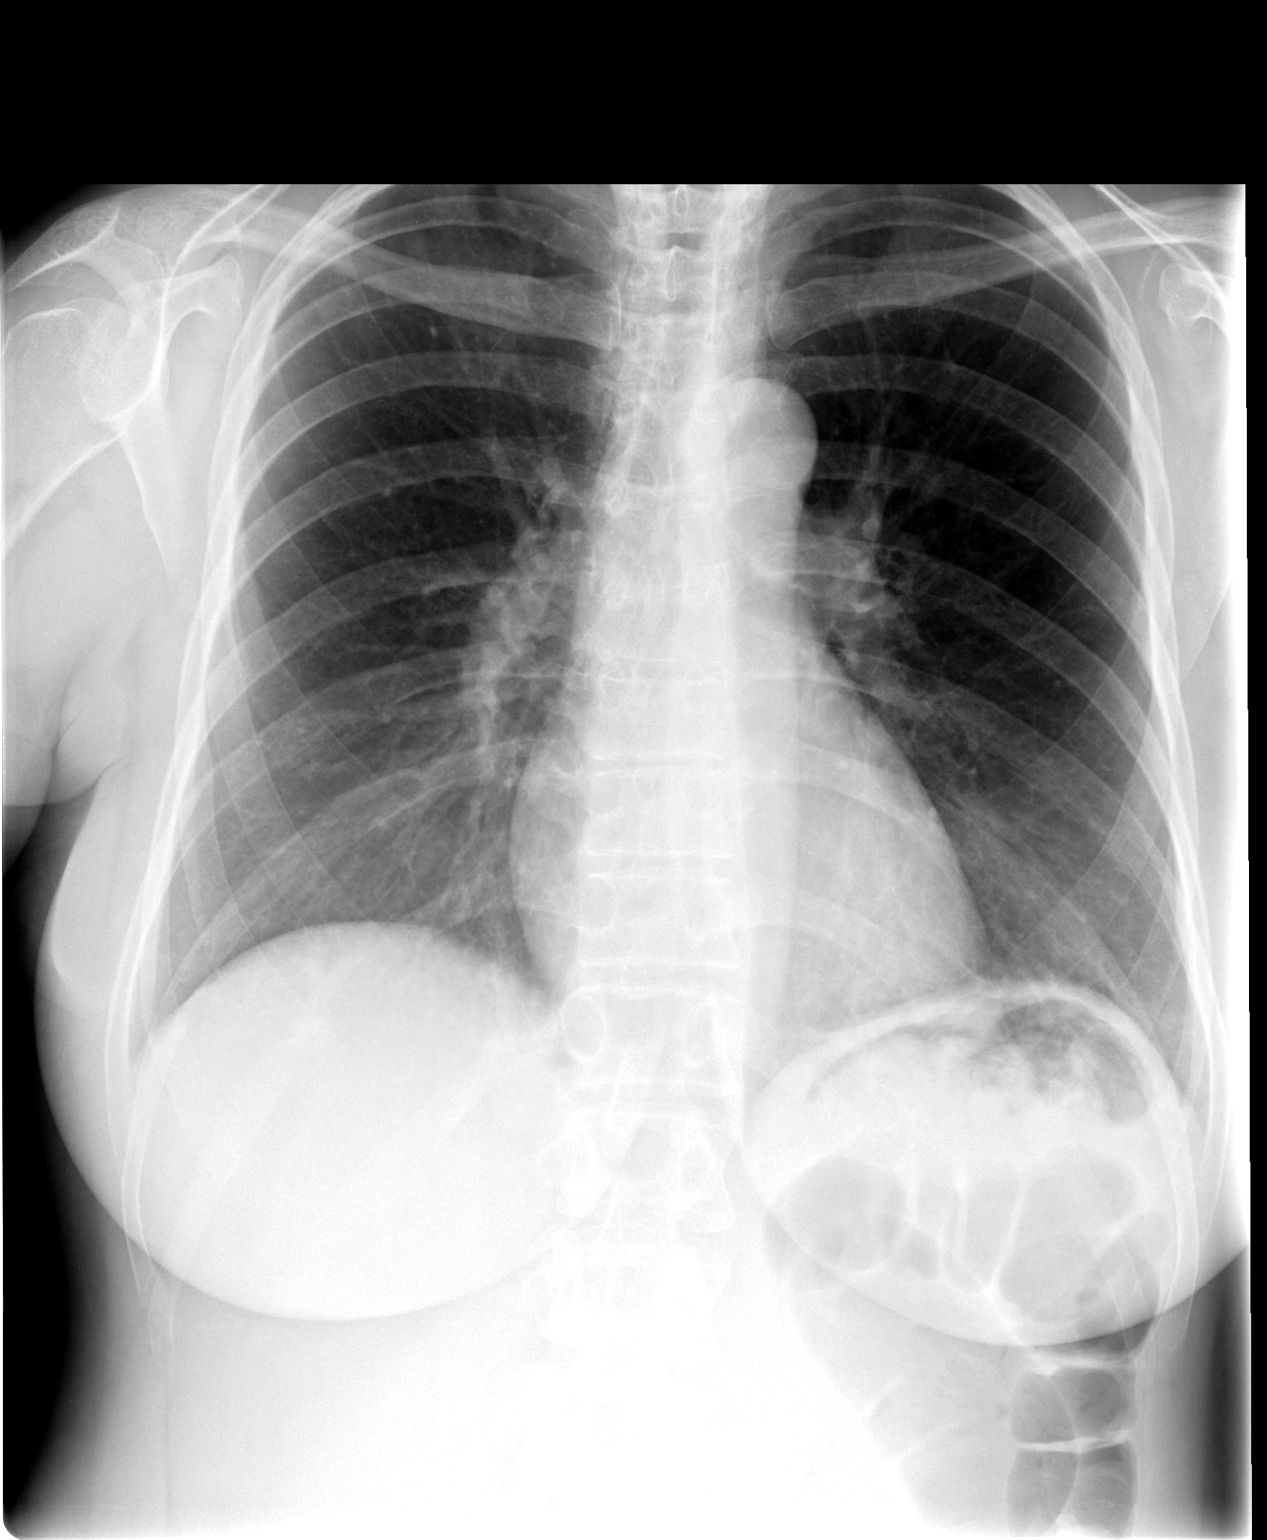

[1 of 1 positions shown; findings below may reference images not displayed]

FINDINGS: The parenchymal opacity noted deep in the right lung base
has improved with minimal opacity remaining.  Otherwise the lungs
are clear.  Mediastinal contours are stable.  The heart is within
normal limits in size.  No bony abnormality is seen.
IMPRESSION: Improvement in parenchymal opacity deep in the right lung base.  No
new infiltrate or effusion.

## 2013-05-12 ENCOUNTER — Ambulatory Visit (INDEPENDENT_AMBULATORY_CARE_PROVIDER_SITE_OTHER): Payer: Self-pay | Admitting: *Deleted

## 2013-05-12 DIAGNOSIS — B20 Human immunodeficiency virus [HIV] disease: Secondary | ICD-10-CM

## 2013-05-12 DIAGNOSIS — Z23 Encounter for immunization: Secondary | ICD-10-CM

## 2013-10-09 ENCOUNTER — Ambulatory Visit (INDEPENDENT_AMBULATORY_CARE_PROVIDER_SITE_OTHER): Payer: Self-pay | Admitting: *Deleted

## 2013-10-09 ENCOUNTER — Other Ambulatory Visit: Payer: Self-pay | Admitting: *Deleted

## 2013-10-09 DIAGNOSIS — Z23 Encounter for immunization: Secondary | ICD-10-CM

## 2013-10-09 DIAGNOSIS — B20 Human immunodeficiency virus [HIV] disease: Secondary | ICD-10-CM

## 2013-10-09 MED ORDER — EFAVIRENZ-EMTRICITAB-TENOFOVIR 600-200-300 MG PO TABS: ORAL_TABLET | ORAL | Status: DC

## 2013-10-09 NOTE — Telephone Encounter (Signed)
ADAP Application 

## 2013-10-28 ENCOUNTER — Other Ambulatory Visit: Payer: Self-pay | Admitting: Infectious Diseases

## 2014-01-07 ENCOUNTER — Other Ambulatory Visit: Payer: Self-pay | Admitting: Infectious Diseases

## 2018-12-20 ENCOUNTER — Other Ambulatory Visit: Payer: Self-pay | Admitting: General Practice

## 2018-12-20 DIAGNOSIS — Z20822 Contact with and (suspected) exposure to covid-19: Secondary | ICD-10-CM

## 2018-12-21 LAB — NOVEL CORONAVIRUS, NAA: SARS-CoV-2, NAA: NOT DETECTED

## 2019-01-27 ENCOUNTER — Other Ambulatory Visit: Payer: Self-pay

## 2019-01-27 DIAGNOSIS — Z113 Encounter for screening for infections with a predominantly sexual mode of transmission: Secondary | ICD-10-CM

## 2019-01-27 DIAGNOSIS — Z79899 Other long term (current) drug therapy: Secondary | ICD-10-CM

## 2019-01-27 DIAGNOSIS — B20 Human immunodeficiency virus [HIV] disease: Secondary | ICD-10-CM

## 2019-01-28 ENCOUNTER — Encounter: Payer: Self-pay | Admitting: Infectious Diseases

## 2019-01-28 LAB — T-HELPER CELL (CD4) - (RCID CLINIC ONLY)
CD4 % Helper T Cell: 5 % — ABNORMAL LOW (ref 33–65)
CD4 T Cell Abs: 70 /uL — ABNORMAL LOW (ref 400–1790)

## 2019-01-28 LAB — URINALYSIS
Bilirubin Urine: NEGATIVE
Glucose, UA: NEGATIVE
Hgb urine dipstick: NEGATIVE
Ketones, ur: NEGATIVE
Leukocytes,Ua: NEGATIVE
Nitrite: NEGATIVE
Protein, ur: NEGATIVE
Specific Gravity, Urine: 1.011 (ref 1.001–1.03)
pH: 7 (ref 5.0–8.0)

## 2019-01-29 LAB — URINE CYTOLOGY ANCILLARY ONLY
Chlamydia: NEGATIVE
Comment: NEGATIVE
Comment: NORMAL
Neisseria Gonorrhea: NEGATIVE

## 2019-02-08 LAB — HIV-1/2 AB - DIFFERENTIATION
HIV-1 antibody: POSITIVE — AB
HIV-2 Ab: NEGATIVE

## 2019-02-08 LAB — COMPLETE METABOLIC PANEL WITH GFR
AG Ratio: 1.1 (calc) (ref 1.0–2.5)
ALT: 16 U/L (ref 6–29)
AST: 22 U/L (ref 10–35)
Albumin: 4.1 g/dL (ref 3.6–5.1)
Alkaline phosphatase (APISO): 85 U/L (ref 37–153)
BUN: 11 mg/dL (ref 7–25)
CO2: 26 mmol/L (ref 20–32)
Calcium: 9.2 mg/dL (ref 8.6–10.4)
Chloride: 102 mmol/L (ref 98–110)
Creat: 0.58 mg/dL (ref 0.50–1.05)
GFR, Est African American: 121 mL/min/{1.73_m2} (ref >=60)
GFR, Est Non African American: 104 mL/min/{1.73_m2} (ref >=60)
Globulin: 3.7 g/dL (calc) (ref 1.9–3.7)
Glucose, Bld: 91 mg/dL (ref 65–99)
Potassium: 3.9 mmol/L (ref 3.5–5.3)
Sodium: 138 mmol/L (ref 135–146)
Total Bilirubin: 0.5 mg/dL (ref 0.2–1.2)
Total Protein: 7.8 g/dL (ref 6.1–8.1)

## 2019-02-08 LAB — CBC WITH DIFFERENTIAL/PLATELET
Absolute Monocytes: 392 cells/uL (ref 200–950)
Basophils Absolute: 32 cells/uL (ref 0–200)
Basophils Relative: 0.8 %
Eosinophils Absolute: 132 cells/uL (ref 15–500)
Eosinophils Relative: 3.3 %
HCT: 40 % (ref 35.0–45.0)
Hemoglobin: 13.1 g/dL (ref 11.7–15.5)
Lymphs Abs: 1352 cells/uL (ref 850–3900)
MCH: 26.7 pg — ABNORMAL LOW (ref 27.0–33.0)
MCHC: 32.8 g/dL (ref 32.0–36.0)
MCV: 81.6 fL (ref 80.0–100.0)
MPV: 12.9 fL — ABNORMAL HIGH (ref 7.5–12.5)
Monocytes Relative: 9.8 %
Neutro Abs: 2092 cells/uL (ref 1500–7800)
Neutrophils Relative %: 52.3 %
Platelets: 173 10*3/uL (ref 140–400)
RBC: 4.9 10*6/uL (ref 3.80–5.10)
RDW: 12.6 % (ref 11.0–15.0)
Total Lymphocyte: 33.8 %
WBC: 4 10*3/uL (ref 3.8–10.8)

## 2019-02-08 LAB — QUANTIFERON-TB GOLD PLUS
Mitogen-NIL: 5.67 IU/mL
NIL: 0.09 IU/mL
QuantiFERON-TB Gold Plus: NEGATIVE
TB1-NIL: <0 IU/mL
TB2-NIL: <0 IU/mL

## 2019-02-08 LAB — HIV-1 RNA ULTRAQUANT REFLEX TO GENTYP+
HIV 1 RNA Quant: 12400 copies/mL — ABNORMAL HIGH
HIV-1 RNA Quant, Log: 4.09 Log copies/mL — ABNORMAL HIGH

## 2019-02-08 LAB — HEPATITIS C ANTIBODY
Hepatitis C Ab: NONREACTIVE
SIGNAL TO CUT-OFF: 0.05 (ref <1.00)

## 2019-02-08 LAB — LIPID PANEL
Cholesterol: 166 mg/dL (ref <200)
HDL: 30 mg/dL — ABNORMAL LOW (ref >=50)
LDL Cholesterol (Calc): 114 mg/dL (calc) — ABNORMAL HIGH
Non-HDL Cholesterol (Calc): 136 mg/dL (calc) — ABNORMAL HIGH (ref <130)
Total CHOL/HDL Ratio: 5.5 (calc) — ABNORMAL HIGH (ref <5.0)
Triglycerides: 114 mg/dL (ref <150)

## 2019-02-08 LAB — HLA B*5701: HLA-B*5701 w/rflx HLA-B High: NEGATIVE

## 2019-02-08 LAB — HIV-1 GENOTYPE: HIV-1 Genotype: DETECTED — AB

## 2019-02-08 LAB — HEPATITIS B SURFACE ANTIBODY,QUALITATIVE: Hep B S Ab: REACTIVE — AB

## 2019-02-08 LAB — HEPATITIS A ANTIBODY, TOTAL: Hepatitis A AB,Total: NONREACTIVE

## 2019-02-08 LAB — HIV ANTIBODY (ROUTINE TESTING W REFLEX): HIV 1&2 Ab, 4th Generation: REACTIVE — AB

## 2019-02-08 LAB — HEPATITIS B SURFACE ANTIGEN: Hepatitis B Surface Ag: NONREACTIVE

## 2019-02-08 LAB — RPR: RPR Ser Ql: NONREACTIVE

## 2019-02-08 LAB — HEPATITIS B CORE ANTIBODY, TOTAL: Hep B Core Total Ab: REACTIVE — AB

## 2019-02-10 ENCOUNTER — Encounter: Payer: Self-pay | Admitting: Infectious Diseases

## 2019-02-10 DIAGNOSIS — Z789 Other specified health status: Secondary | ICD-10-CM | POA: Insufficient documentation

## 2019-02-11 ENCOUNTER — Encounter: Payer: Self-pay | Admitting: Infectious Diseases

## 2019-02-11 ENCOUNTER — Other Ambulatory Visit: Payer: Self-pay

## 2019-02-11 ENCOUNTER — Ambulatory Visit (INDEPENDENT_AMBULATORY_CARE_PROVIDER_SITE_OTHER): Payer: Self-pay | Admitting: Infectious Diseases

## 2019-02-11 DIAGNOSIS — F17200 Nicotine dependence, unspecified, uncomplicated: Secondary | ICD-10-CM

## 2019-02-11 DIAGNOSIS — B20 Human immunodeficiency virus [HIV] disease: Secondary | ICD-10-CM

## 2019-02-11 DIAGNOSIS — I10 Essential (primary) hypertension: Secondary | ICD-10-CM

## 2019-02-11 DIAGNOSIS — B3781 Candidal esophagitis: Secondary | ICD-10-CM

## 2019-02-11 DIAGNOSIS — B370 Candidal stomatitis: Secondary | ICD-10-CM

## 2019-02-11 HISTORY — DX: Candidal stomatitis: B37.0

## 2019-02-11 HISTORY — DX: Candidal esophagitis: B37.81

## 2019-02-11 MED ORDER — FLUCONAZOLE 100 MG PO TABS: 100.0000 mg | ORAL_TABLET | Freq: Every day | ORAL | 0 refills | Status: DC

## 2019-02-11 MED ORDER — BICTEGRAVIR-EMTRICITAB-TENOFOV 50-200-25 MG PO TABS: 1.0000 | ORAL_TABLET | Freq: Every day | ORAL | 3 refills | Status: DC

## 2019-02-11 NOTE — Progress Notes (Signed)
   Subjective:    Patient ID: Melinda Nielsen, female    DOB: Dec 09, 1965, 54 y.o.   MRN: [id number]  HPI 54 yo F with hx of HIV+ since 2006. She was previously on [place] but was lost to follow up.  She went to the ED 05-31-10 with 3 weeks of enlarging, painful right neck mass. In the hospital she was found to have a "right mid to posterior neck mass measuring 3.6 x 4.2 cm with central liquefied appearance. This appears to be due to multiple enlarged lymph nodes. 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  Her CD4 was 60, VL 43,300. Genotype background PR mutation (K20R,L33Vs).  She returned to ID clinic 06-08-10 and was restarted on atripla. She has been eval at [facility]NT for consideration of resection of her scrofulous area but the area broke down and began to drain.She was eval at health dept and was started on anti-TB medications. She completed these on December 2012.  She has not been seen in ID since 2015. She has been off atripla since 2015.  She has thrush today and dysphagia. Feels like she has GERD.  Has had some mild wt loss. Has been getting monthly COVID test.   HIV 1 RNA Quant (copies/mL)  Date Value  01/27/2019 12,400 (H)  04/11/2013 <20  08/13/2012 20   CD4 T Cell Abs  Date Value  01/27/2019 70 /uL (L)  04/11/2013 260 /uL (L)  08/13/2012 250 cmm (L)   The past medical history, family history and social history were reviewed/updated in Epic 5 yo Daughter, 31 yo daughter, 53 yo son.   Review of Systems  Constitutional: Positive for unexpected weight change. Negative for appetite change.  HENT: Positive for mouth sores, sore throat and trouble swallowing.   Respiratory: Negative for cough and shortness of breath.   Cardiovascular: Negative for chest pain.  Gastrointestinal: Negative for constipation and diarrhea.  Genitourinary: Negative for difficulty urinating.  Neurological: Negative for headaches.  Psychiatric/Behavioral: Negative for sleep disturbance.    Please see HPI. All other systems reviewed and negative.      Objective:   Physical Exam Vitals reviewed.  Constitutional:      Appearance: Normal appearance.  HENT:     Mouth/Throat:     Mouth: Mucous membranes are moist.     Pharynx: Oropharyngeal exudate present.  Eyes:     Extraocular Movements: Extraocular movements intact.     Pupils: Pupils are equal, round, and reactive to light.  Cardiovascular:     Rate and Rhythm: Normal rate and regular rhythm.  Pulmonary:     Effort: Pulmonary effort is normal.     Breath sounds: Normal breath sounds.  Abdominal:     General: Bowel sounds are normal. There is no distension.     Palpations: Abdomen is soft.     Tenderness: There is no abdominal tenderness.  Musculoskeletal:     Cervical back: Normal range of motion and neck supple.     Right lower leg: No edema.     Left lower leg: No edema.  Neurological:     General: No focal deficit present.     Mental Status: She is alert.  Psychiatric:        Mood and Affect: Mood normal.           Assessment & Plan:

## 2019-02-11 NOTE — Assessment & Plan Note (Signed)
She's asx Will start her on anti-htn rx after we get her back on her HIV rx.

## 2019-02-11 NOTE — Assessment & Plan Note (Signed)
Encouraged to quit. 

## 2019-02-11 NOTE — Addendum Note (Signed)
Addended by: Mersades Barbaro C on: 02/11/2019 03:55 PM   Modules accepted: Orders

## 2019-02-11 NOTE — Assessment & Plan Note (Signed)
Will start her on fluconazole for 10 days.

## 2019-02-11 NOTE — Assessment & Plan Note (Addendum)
Will restart her on ART. biktarvy. Had  Flu vaccine Will give her PCV 13 given condoms.  mammo and PAP Meeting with [facility] today.  rtc in 2 months

## 2019-04-11 ENCOUNTER — Ambulatory Visit: Payer: Self-pay | Admitting: Infectious Diseases

## 2019-05-06 ENCOUNTER — Ambulatory Visit (INDEPENDENT_AMBULATORY_CARE_PROVIDER_SITE_OTHER): Payer: Self-pay | Admitting: Infectious Diseases

## 2019-05-06 ENCOUNTER — Other Ambulatory Visit: Payer: Self-pay

## 2019-05-06 ENCOUNTER — Other Ambulatory Visit: Payer: Self-pay | Admitting: *Deleted

## 2019-05-06 VITALS — BP 167/100 | HR 94 | Temp 98.1°F | Wt 198.0 lb

## 2019-05-06 DIAGNOSIS — K089 Disorder of teeth and supporting structures, unspecified: Secondary | ICD-10-CM

## 2019-05-06 DIAGNOSIS — Z113 Encounter for screening for infections with a predominantly sexual mode of transmission: Secondary | ICD-10-CM

## 2019-05-06 DIAGNOSIS — Z79899 Other long term (current) drug therapy: Secondary | ICD-10-CM

## 2019-05-06 DIAGNOSIS — I10 Essential (primary) hypertension: Secondary | ICD-10-CM

## 2019-05-06 DIAGNOSIS — B20 Human immunodeficiency virus [HIV] disease: Secondary | ICD-10-CM

## 2019-05-06 DIAGNOSIS — F17200 Nicotine dependence, unspecified, uncomplicated: Secondary | ICD-10-CM

## 2019-05-06 MED ORDER — LOSARTAN POTASSIUM 25 MG PO TABS: 25.0000 mg | ORAL_TABLET | Freq: Every day | ORAL | 3 refills | Status: DC

## 2019-05-06 NOTE — Progress Notes (Signed)
   Subjective:    Patient ID: Melinda Nielsen, female    DOB: 1965/04/02, 54 y.o.   MRN: [id number]  HPI 54 yo F with hx of HIV+ since 2006. She was previously on [place] but was lost to follow up.  She went to the ED 05-31-10 with 3 weeks of enlarging, painful right neck mass. In the hospital she was found to have a "right mid to posterior neck mass measuring 3.6 x 4.2 cm with central liquefied appearance. This appears to be due to multiple enlarged lymph nodes. 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  Her CD4 was 60, VL 43,300. Genotype background PR mutation (K20R,L33Vs).  She returned to ID clinic 06-08-10 and was restarted on atripla. She has been eval at [facility] for consideration of resection of her scrofulous area but the area broke down and began to drain.She was eval at health dept and was started on anti-TB medications. She completed these on December 2012.  She has not been seen in ID since 2015. She has been off atripla since 2015.  She returned with thrush and dysphagia January 2021.  She was started on biktarvy.  Has been gaining wt. No problems remembering to take.  BP noted to be high today- attributes this to anxiety to coming here.  Has gym membership, has only been once.  Has had 1st COVID vax.   HIV 1 RNA Quant (copies/mL)  Date Value  01/27/2019 12,400 (H)  04/11/2013 <20  08/13/2012 20   CD4 T Cell Abs  Date Value  01/27/2019 70 /uL (L)  04/11/2013 260 /uL (L)  08/13/2012 250 cmm (L)    Review of Systems  Constitutional: Positive for unexpected weight change. Negative for appetite change, chills and fever.  Respiratory: Negative for shortness of breath.   Cardiovascular: Negative for chest pain.  Gastrointestinal: Negative for constipation and diarrhea.  Genitourinary: Negative for difficulty urinating.  Neurological: Negative for headaches.  Psychiatric/Behavioral: Positive for sleep disturbance. The patient is nervous/anxious (looking for place  to live).        Objective:   Physical Exam Constitutional:      General: She is not in acute distress.    Appearance: Normal appearance. She is obese. She is not toxic-appearing.  HENT:     Mouth/Throat:     Mouth: Mucous membranes are moist.     Pharynx: No oropharyngeal exudate.  Eyes:     Extraocular Movements: Extraocular movements intact.     Pupils: Pupils are equal, round, and reactive to light.  Cardiovascular:     Rate and Rhythm: Normal rate and regular rhythm.  Pulmonary:     Effort: Pulmonary effort is normal.     Breath sounds: Normal breath sounds.  Abdominal:     General: Bowel sounds are normal. There is no distension.     Palpations: Abdomen is soft.     Tenderness: There is no abdominal tenderness.  Musculoskeletal:     Cervical back: Normal range of motion and neck supple.     Right lower leg: No edema.     Left lower leg: No edema.  Neurological:     General: No focal deficit present.     Mental Status: She is alert.  Psychiatric:        Mood and Affect: Mood normal.           Assessment & Plan:

## 2019-05-06 NOTE — Assessment & Plan Note (Signed)
She is doing well except wt gain Needs pap, mammo Has gotten COVID #1 Has condoms.  Will see back in 9 months for HIV f/u.

## 2019-05-06 NOTE — Progress Notes (Signed)
Patient given primary care provider information. Landis Gandy, RN

## 2019-05-06 NOTE — Assessment & Plan Note (Signed)
She will f/u with her dentist.

## 2019-05-06 NOTE — Assessment & Plan Note (Signed)
She is asx but quite elevated on 2 readings today.  Will start her on ARB.  rtc in 2 weeks.

## 2019-05-06 NOTE — Assessment & Plan Note (Signed)
Encouraged to quit. 

## 2019-05-20 ENCOUNTER — Other Ambulatory Visit: Payer: Self-pay

## 2019-06-19 ENCOUNTER — Ambulatory Visit: Payer: Self-pay | Admitting: Infectious Diseases
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
## 2019-09-04 ENCOUNTER — Other Ambulatory Visit: Payer: Self-pay | Admitting: Infectious Diseases

## 2019-09-04 DIAGNOSIS — I10 Essential (primary) hypertension: Secondary | ICD-10-CM

## 2020-01-22 ENCOUNTER — Other Ambulatory Visit: Payer: Self-pay

## 2020-02-04 ENCOUNTER — Other Ambulatory Visit: Payer: Self-pay

## 2020-02-04 DIAGNOSIS — I10 Essential (primary) hypertension: Secondary | ICD-10-CM

## 2020-02-04 DIAGNOSIS — B20 Human immunodeficiency virus [HIV] disease: Secondary | ICD-10-CM

## 2020-02-04 MED ORDER — LOSARTAN POTASSIUM 25 MG PO TABS: ORAL_TABLET | ORAL | 0 refills | Status: DC

## 2020-02-04 MED ORDER — BICTEGRAVIR-EMTRICITAB-TENOFOV 50-200-25 MG PO TABS: 1.0000 | ORAL_TABLET | Freq: Every day | ORAL | 0 refills | Status: DC

## 2020-02-05 ENCOUNTER — Encounter: Payer: Self-pay | Admitting: Infectious Diseases

## 2020-02-17 ENCOUNTER — Ambulatory Visit (INDEPENDENT_AMBULATORY_CARE_PROVIDER_SITE_OTHER): Payer: Self-pay | Admitting: Infectious Diseases

## 2020-02-17 ENCOUNTER — Encounter: Payer: Self-pay | Admitting: Infectious Diseases

## 2020-02-17 ENCOUNTER — Telehealth: Payer: Self-pay

## 2020-02-17 ENCOUNTER — Other Ambulatory Visit: Payer: Self-pay

## 2020-02-17 VITALS — BP 176/116 | HR 89 | Temp 97.6°F | Wt 217.0 lb

## 2020-02-17 DIAGNOSIS — I10 Essential (primary) hypertension: Secondary | ICD-10-CM

## 2020-02-17 DIAGNOSIS — Z23 Encounter for immunization: Secondary | ICD-10-CM

## 2020-02-17 DIAGNOSIS — B20 Human immunodeficiency virus [HIV] disease: Secondary | ICD-10-CM

## 2020-02-17 DIAGNOSIS — Z113 Encounter for screening for infections with a predominantly sexual mode of transmission: Secondary | ICD-10-CM

## 2020-02-17 DIAGNOSIS — A630 Anogenital (venereal) warts: Secondary | ICD-10-CM

## 2020-02-17 DIAGNOSIS — Z79899 Other long term (current) drug therapy: Secondary | ICD-10-CM

## 2020-02-17 DIAGNOSIS — F17200 Nicotine dependence, unspecified, uncomplicated: Secondary | ICD-10-CM

## 2020-02-17 MED ORDER — BICTEGRAVIR-EMTRICITAB-TENOFOV 50-200-25 MG PO TABS: 1.0000 | ORAL_TABLET | Freq: Every day | ORAL | 0 refills | Status: DC

## 2020-02-17 MED ORDER — LOSARTAN POTASSIUM 50 MG PO TABS: ORAL_TABLET | ORAL | 3 refills | Status: DC

## 2020-02-17 NOTE — Assessment & Plan Note (Addendum)
She is asx. States she has been taking cozaar. Will increase her dose, rtc in 1 month

## 2020-02-17 NOTE — Assessment & Plan Note (Signed)
Encouraged her to quit 

## 2020-02-17 NOTE — Progress Notes (Signed)
Subjective:    Patient ID: Melinda Nielsen, female  DOB: 09-24-1965, 55 y.o.        MRN: [id number] HPI 55yo F with hx of HIV+ since 2006 when pregnant (son -). She was previously on [place] but was lost to follow up. She went to the ED 05-31-10 with 3 weeks of enlarging, painful right neck mass.  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 Her CD4 was 60, VL 43,300. Genotype background PR mutation (K20R,L33Vs).  She returned to ID clinic 06-08-10 and was restarted on atripla. She has been eval at [facility] for consideration of resection of her scrofulous area but the area broke down and began to drain.She was eval at health dept and was started on anti-TB medications. She completed these on December 2012. She has not been seen in ID since 2015. She has been off atripla since 2015.  She returned with thrush and dysphagia January 2021.  She was started on biktarvy. She has been off meds (ADAP lapse 09-2019).  Has been taking on alternating days.   Believes her BP is up due to MD visit. Has been feeling well.  Wants to lose wt. States she does exercise.  Wants flu shot.    HIV 1 RNA Quant (copies/mL)  Date Value  01/27/2019 12,400 (H)  04/11/2013 <20  08/13/2012 20   CD4 T Cell Abs  Date Value  01/27/2019 70 /uL (L)  04/11/2013 260 /uL (L)  08/13/2012 250 cmm (L)     Health Maintenance  Topic Date Due  . COVID-19 Vaccine (1) Never done  . TETANUS/TDAP  Never done  . MAMMOGRAM  04/04/2007  . COLONOSCOPY (Pts 45-68yrs Insurance coverage will need to be confirmed)  Never done  . PAP SMEAR-Modifier  04/12/2014  . INFLUENZA VACCINE  08/17/2019  . Hepatitis C Screening  Completed  . HIV Screening  Completed      Review of Systems  Constitutional: Negative for chills, fever and weight loss.  Respiratory: Negative for cough and wheezing.   Cardiovascular: Negative for chest pain.  Gastrointestinal: Negative for constipation and diarrhea.  Genitourinary: Negative  for dysuria.  Neurological: Negative for headaches.   Please see HPI. All other systems reviewed and negative.     Objective:  Physical Exam Vitals reviewed.  Constitutional:      Appearance: Normal appearance.  HENT:     Mouth/Throat:     Mouth: Mucous membranes are moist.     Pharynx: No oropharyngeal exudate.  Eyes:     Extraocular Movements: Extraocular movements intact.     Pupils: Pupils are equal, round, and reactive to light.  Cardiovascular:     Rate and Rhythm: Normal rate and regular rhythm.  Pulmonary:     Effort: Pulmonary effort is normal.     Breath sounds: Normal breath sounds.  Abdominal:     General: Bowel sounds are normal. There is no distension.     Palpations: Abdomen is soft.     Tenderness: There is no abdominal tenderness.  Musculoskeletal:        General: Normal range of motion.     Cervical back: Normal range of motion and neck supple.     Right lower leg: No edema.     Left lower leg: No edema.  Neurological:     General: No focal deficit present.     Mental Status: She is alert.            Assessment & Plan:

## 2020-02-17 NOTE — Telephone Encounter (Addendum)
[facility] Patient Advocate Encounter  Completed and sent [employer] Advancing Access application for [place] for this patient who is uninsured.    Patient is approved 02/17/20 through 02/16/21.        Ileene Patrick, [facility] Patient [facility] Phone: [phone] Fax:  [phone]

## 2020-02-17 NOTE — Assessment & Plan Note (Addendum)
Flu shot today She has gotten covid vax, needs booster.  Given condoms Will get her in for pap and mammo.  Needs PCV13.  Needs colon Will get her emergency supply of art. Renew her adap.  Will see her back in 1 month.

## 2020-02-17 NOTE — Assessment & Plan Note (Signed)
Needs to get colon. Will sched when she in on her art.

## 2020-02-18 LAB — T-HELPER CELL (CD4) - (RCID CLINIC ONLY)
CD4 % Helper T Cell: 10 % — ABNORMAL LOW (ref 33–65)
CD4 T Cell Abs: 273 /uL — ABNORMAL LOW (ref 400–1790)

## 2020-02-18 LAB — URINE CYTOLOGY ANCILLARY ONLY
Chlamydia: NEGATIVE
Comment: NEGATIVE
Comment: NORMAL
Neisseria Gonorrhea: NEGATIVE

## 2020-03-01 LAB — LIPID PANEL
Cholesterol: 172 mg/dL
HDL: 54 mg/dL
LDL Cholesterol (Calc): 104 mg/dL — ABNORMAL HIGH
Non-HDL Cholesterol (Calc): 118 mg/dL
Total CHOL/HDL Ratio: 3.2 (calc)
Triglycerides: 54 mg/dL

## 2020-03-01 LAB — CBC
HCT: 39.4 % (ref 35.0–45.0)
Hemoglobin: 12.8 g/dL (ref 11.7–15.5)
MCH: 27.9 pg (ref 27.0–33.0)
MCHC: 32.5 g/dL (ref 32.0–36.0)
MCV: 86 fL (ref 80.0–100.0)
MPV: 11.7 fL (ref 7.5–12.5)
Platelets: 248 10*3/uL (ref 140–400)
RBC: 4.58 10*6/uL (ref 3.80–5.10)
RDW: 13.1 % (ref 11.0–15.0)
WBC: 6.5 10*3/uL (ref 3.8–10.8)

## 2020-03-01 LAB — COMPREHENSIVE METABOLIC PANEL
AG Ratio: 1.4 (calc) (ref 1.0–2.5)
ALT: 15 U/L (ref 6–29)
AST: 20 U/L (ref 10–35)
Albumin: 4.3 g/dL (ref 3.6–5.1)
Alkaline phosphatase (APISO): 117 U/L (ref 37–153)
BUN: 15 mg/dL (ref 7–25)
CO2: 26 mmol/L (ref 20–32)
Calcium: 9.3 mg/dL (ref 8.6–10.4)
Chloride: 105 mmol/L (ref 98–110)
Creat: 0.62 mg/dL (ref 0.50–1.05)
Globulin: 3.1 g/dL (calc) (ref 1.9–3.7)
Glucose, Bld: 81 mg/dL (ref 65–99)
Potassium: 4.1 mmol/L (ref 3.5–5.3)
Sodium: 140 mmol/L (ref 135–146)
Total Bilirubin: 0.4 mg/dL (ref 0.2–1.2)
Total Protein: 7.4 g/dL (ref 6.1–8.1)

## 2020-03-01 LAB — HIV-1 INTEGRASE GENOTYPE

## 2020-03-01 LAB — HIV-1 RNA ULTRAQUANT REFLEX TO GENTYP+
HIV 1 RNA Quant: <20 copies/mL
HIV-1 RNA Quant, Log: <1.3 Log copies/mL

## 2020-03-01 LAB — SYPHILIS: RPR W/REFLEX TO RPR TITER AND TREPONEMAL ANTIBODIES, TRADITIONAL SCREENING AND DIAGNOSIS ALGORITHM: RPR Ser Ql: NONREACTIVE

## 2020-03-09 ENCOUNTER — Encounter: Payer: Self-pay | Admitting: Infectious Diseases

## 2020-03-11 ENCOUNTER — Other Ambulatory Visit: Payer: Self-pay | Admitting: Infectious Diseases

## 2020-03-11 DIAGNOSIS — I10 Essential (primary) hypertension: Secondary | ICD-10-CM

## 2020-03-15 ENCOUNTER — Telehealth: Payer: Self-pay

## 2020-03-15 NOTE — Telephone Encounter (Signed)
Spoke with [facility] who state patient's ADAP has expired. Will need updated information before they can fill prescription. Will forward message to Financial Counselor to call pharmacy.

## 2020-03-15 NOTE — Telephone Encounter (Signed)
Patient called office today stating she is having issues filling her Biktarvy. States that she was told her prescription would cost $3,000 out of pocket. Per note patient has renewed financial assistance.  Will follow up with [facility] for more information.

## 2020-03-15 NOTE — Telephone Encounter (Signed)
Attempted to call patient with update. Not able to reach her at this time.

## 2020-03-16 NOTE — Telephone Encounter (Signed)
Per Timmothy Sours, Financial Counselor patient's financial assistance has lapsed. Waiting on approval letter.  Attempted to call patient to inform her that office can provide her medication to get her through until ADAP is approved. Not able to reach patient at this time.

## 2020-03-17 ENCOUNTER — Other Ambulatory Visit: Payer: Self-pay | Admitting: Infectious Diseases

## 2020-03-17 DIAGNOSIS — B20 Human immunodeficiency virus [HIV] disease: Secondary | ICD-10-CM

## 2020-04-02 ENCOUNTER — Encounter: Payer: Self-pay | Admitting: Infectious Diseases

## 2020-04-02 ENCOUNTER — Ambulatory Visit (INDEPENDENT_AMBULATORY_CARE_PROVIDER_SITE_OTHER): Payer: Self-pay | Admitting: Infectious Diseases

## 2020-04-02 ENCOUNTER — Other Ambulatory Visit: Payer: Self-pay

## 2020-04-02 VITALS — BP 172/115 | HR 79 | Temp 97.9°F | Wt 212.0 lb

## 2020-04-02 VITALS — BP 172/115 | Temp 97.9°F | Wt 212.0 lb

## 2020-04-02 DIAGNOSIS — Z79899 Other long term (current) drug therapy: Secondary | ICD-10-CM

## 2020-04-02 DIAGNOSIS — N898 Other specified noninflammatory disorders of vagina: Secondary | ICD-10-CM | POA: Insufficient documentation

## 2020-04-02 DIAGNOSIS — R87618 Other abnormal cytological findings on specimens from cervix uteri: Secondary | ICD-10-CM

## 2020-04-02 DIAGNOSIS — B20 Human immunodeficiency virus [HIV] disease: Secondary | ICD-10-CM

## 2020-04-02 DIAGNOSIS — I10 Essential (primary) hypertension: Secondary | ICD-10-CM

## 2020-04-02 DIAGNOSIS — Z113 Encounter for screening for infections with a predominantly sexual mode of transmission: Secondary | ICD-10-CM

## 2020-04-02 DIAGNOSIS — R87612 Low grade squamous intraepithelial lesion on cytologic smear of cervix (LGSIL): Secondary | ICD-10-CM

## 2020-04-02 DIAGNOSIS — F17200 Nicotine dependence, unspecified, uncomplicated: Secondary | ICD-10-CM

## 2020-04-02 DIAGNOSIS — Z124 Encounter for screening for malignant neoplasm of cervix: Secondary | ICD-10-CM | POA: Insufficient documentation

## 2020-04-02 DIAGNOSIS — A630 Anogenital (venereal) warts: Secondary | ICD-10-CM

## 2020-04-02 MED ORDER — NICOTINE 14 MG/24HR TD PT24: 14.0000 mg | MEDICATED_PATCH | Freq: Every day | TRANSDERMAL | 0 refills | Status: DC

## 2020-04-02 MED ORDER — NICOTINE 7 MG/24HR TD PT24: 7.0000 mg | MEDICATED_PATCH | Freq: Every day | TRANSDERMAL | 0 refills | Status: DC

## 2020-04-02 MED ORDER — NICOTINE 21 MG/24HR TD PT24: 21.0000 mg | MEDICATED_PATCH | Freq: Every day | TRANSDERMAL | 0 refills | Status: AC

## 2020-04-02 NOTE — Assessment & Plan Note (Signed)
Will give her nicotine patches

## 2020-04-02 NOTE — Assessment & Plan Note (Signed)
Encouraged her to stop smoking to help her blood pressure and reduce CVD risk - explained that now as she is post-menopausal her risk for cardiac events increase naturally as it is.

## 2020-04-02 NOTE — Assessment & Plan Note (Signed)
Normal pelvic exam aside from some discharge that was collected for sampling.  Cervical brushing collected for cytology and HPV.  Discussed recommended screening interval for women living with HIV disease meant to be lifelong and at an interval of Q1-3 years pending results. Acceptable to space out Q3y with 3 consecutively normal exams.  Last pap 2015 was normal. No h/o abnormal results in the past.  Further recommendations for Melinda Nielsen to follow today's results.  Results will be communicated to the patient via telephone call.
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
## 2020-04-02 NOTE — Assessment & Plan Note (Addendum)
She is doing well Dental referral placed today for [facility]. Information to schedule appointment completed today. Will send for colon.  PCV in 2 weeks or more Offered/refused condoms. Will see her back in 9 months.

## 2020-04-02 NOTE — Assessment & Plan Note (Signed)
Appreciate Melinda Nielsen's f/u

## 2020-04-02 NOTE — Assessment & Plan Note (Signed)
She expressed a lot of fatigue with regards to taking her medications. Fluctuations with CD4. I told her I know she knows her body but I would prefer she keeps it in the best shape possible and consider taking her medications routinely without lapse.

## 2020-04-02 NOTE — Assessment & Plan Note (Signed)
Ask her to take in AM Check BP at home.
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
## 2020-04-02 NOTE — Progress Notes (Signed)
Subjective:    Patient ID: Melinda Nielsen, female  DOB: 06/09/1965, 55 y.o.        MRN: [id number]   HPI 55yo F with hx of HIV+ since 2006 when pregnant (son -). She was previously on [place] but was lost to follow up. She went to the ED 05-31-10 with 3 weeks of enlarging, painful right neck mass.  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 Her CD4 was 60, VL 43,300. Genotype background PR mutation (K20R,L33Vs).  She returned to ID clinic 06-08-10 and was restarted on atripla. She was eval at health dept and was started on anti-TB medications. She completed these on December 2012. Shereturned withthrush and dysphagia January 2021, off ART since 2015.  She was started on biktarvy. She has been off meds (ADAP lapse 09-2019).   Had PAP today.  Her BP is up today. Has been taking her rx, though not yet today.  Had work injury on her L knee, working on getting covered for this.  Has lost 5#, has been working on her diet.  Has decreased her smoking. 1/2 ppd.   HIV 1 RNA Quant (copies/mL)  Date Value  02/17/2020 <20 NOT DETECTED  01/27/2019 12,400 (H)  04/11/2013 <20   CD4 T Cell Abs (/uL)  Date Value  02/17/2020 273 (L)  01/27/2019 70 (L)  04/11/2013 260 (L)     Health Maintenance  Topic Date Due  . TETANUS/TDAP  Never done  . MAMMOGRAM  04/04/2007  . COLONOSCOPY (Pts 45-63yrs Insurance coverage will need to be confirmed)  Never done  . PAP SMEAR-Modifier  04/12/2014  . COVID-19 Vaccine (3 - Pfizer risk 4-dose series) 08/14/2019  . INFLUENZA VACCINE  Completed  . Hepatitis C Screening  Completed  . HIV Screening  Completed  . HPV VACCINES  Aged Out    Review of Systems  Constitutional: Negative for chills, fever and weight loss.  Respiratory: Positive for cough (just this week). Negative for shortness of breath.   Cardiovascular: Positive for leg swelling. Negative for chest pain.  Gastrointestinal: Negative for constipation and diarrhea.  Genitourinary:  Negative for dysuria.  Neurological: Positive for headaches.  Psychiatric/Behavioral: The patient does not have insomnia.     Please see HPI. All other systems reviewed and negative.     Objective:  Physical Exam Vitals reviewed.  Constitutional:      Appearance: Normal appearance. She is obese.  HENT:     Mouth/Throat:     Mouth: Mucous membranes are moist.     Pharynx: No oropharyngeal exudate.  Eyes:     Extraocular Movements: Extraocular movements intact.     Pupils: Pupils are equal, round, and reactive to light.  Cardiovascular:     Rate and Rhythm: Normal rate and regular rhythm.  Pulmonary:     Effort: Pulmonary effort is normal.     Breath sounds: Normal breath sounds.  Abdominal:     General: Bowel sounds are normal. There is no distension.     Palpations: Abdomen is soft.     Tenderness: There is no abdominal tenderness.  Musculoskeletal:     Cervical back: Normal range of motion and neck supple.     Right lower leg: Edema present.     Left lower leg: Edema present.  Neurological:     General: No focal deficit present.     Mental Status: She is alert.  Psychiatric:        Mood and Affect: Mood normal.  Assessment & Plan:

## 2020-04-02 NOTE — Assessment & Plan Note (Signed)
Suspect either bacterial vaginitis vs candida. She has some discomfort at times. Will wait for results and treat appropriately.

## 2020-04-05 LAB — CERVICOVAGINAL ANCILLARY ONLY
Chlamydia: NEGATIVE
Comment: NEGATIVE
Comment: NORMAL
Neisseria Gonorrhea: NEGATIVE

## 2020-04-07 LAB — CYTOLOGY - PAP
Chlamydia: NEGATIVE
Comment: NEGATIVE
Comment: NEGATIVE
Comment: NEGATIVE
Comment: NORMAL
HPV 16: NEGATIVE
HPV 18 / 45: NEGATIVE
High risk HPV: POSITIVE — AB
Neisseria Gonorrhea: NEGATIVE

## 2020-04-08 ENCOUNTER — Telehealth: Payer: Self-pay | Admitting: *Deleted

## 2020-04-08 NOTE — Telephone Encounter (Signed)
Left message stating a nurse was calling from the doctor's office. Landis Gandy, RN

## 2020-04-08 NOTE — Addendum Note (Signed)
Addended by: [place] Callas on: 04/08/2020 01:38 PM   Modules accepted: Orders

## 2020-04-08 NOTE — Telephone Encounter (Signed)
-----   Message from [place] Callas, NP sent at 04/08/2020  1:36 PM EDT ----- Attempted to call patient to discuss abnormal pap smear results. She has abnormalities we need to send her to GYN for colposcopy.   Will route to triage to try to reach her again. Referral placed.

## 2020-04-08 NOTE — Progress Notes (Signed)
Attempted to call patient to discuss abnormal pap smear results. She has abnormalities we need to send her to GYN for colposcopy.   Will route to triage to try to reach her again. Referral placed.

## 2020-04-09 ENCOUNTER — Encounter: Payer: Self-pay | Admitting: Infectious Diseases

## 2020-04-09 ENCOUNTER — Telehealth: Payer: Self-pay

## 2020-04-09 NOTE — Telephone Encounter (Signed)
Patient returned call today regarding multiple missed calls. Patient understands she was referred to OBGYN regarding abdnomal pap results. Would like for [place], NP to call her at [phone] for more information regarding results.

## 2020-04-09 NOTE — Telephone Encounter (Signed)
-----   Message from [place] Callas, NP sent at 04/08/2020  1:36 PM EDT ----- Attempted to call patient to discuss abnormal pap smear results. She has abnormalities we need to send her to GYN for colposcopy.   Will route to triage to try to reach her again. Referral placed.

## 2020-04-09 NOTE — Telephone Encounter (Signed)
Called patient to discuss lab work. Left vm requesting return call.   Melinda Nielsen [name]

## 2020-04-12 NOTE — Telephone Encounter (Signed)
LVM on requested telephone for call back, HIPPA compliant and without specifics.   If she calls back, I would like her to know that she has some low grade changes tot he cells on her cervix that is due to a virus called human papilloma virus (HPV). This is unfortunately a very common thing many men and women carry.   The better test we need to do is a procedure called a colposcopy where we can investigate for her what this means. Very often, these results are just something we need to monitor over time with repeated pap smears, but occasionally we need to do a biopsy (pinching off some tissue to look at under a microscope) to look thoroughly and ensure there is nothing concerning we need to worry about now.   ~Chantell Kunkler

## 2020-04-12 NOTE — Telephone Encounter (Signed)
Patient aware and is looking forward to seeing GYN

## 2020-04-12 NOTE — Telephone Encounter (Signed)
Glad we were able to connect with her! Thanks for relaying the message

## 2020-04-15 ENCOUNTER — Other Ambulatory Visit: Payer: Self-pay | Admitting: Obstetrics and Gynecology

## 2020-04-15 DIAGNOSIS — Z1231 Encounter for screening mammogram for malignant neoplasm of breast: Secondary | ICD-10-CM

## 2020-05-06 ENCOUNTER — Ambulatory Visit: Payer: Self-pay | Admitting: *Deleted

## 2020-05-06 ENCOUNTER — Other Ambulatory Visit: Payer: Self-pay

## 2020-05-06 ENCOUNTER — Ambulatory Visit
Admission: RE | Admit: 2020-05-06 | Discharge: 2020-05-06 | Disposition: A | Discharge status: Home / Self Care | Payer: No Typology Code available for payment source | Source: Ambulatory Visit | Attending: Obstetrics and Gynecology | Admitting: Obstetrics and Gynecology

## 2020-05-06 VITALS — BP 146/98 | Wt 213.1 lb

## 2020-05-06 DIAGNOSIS — Z1239 Encounter for other screening for malignant neoplasm of breast: Secondary | ICD-10-CM

## 2020-05-06 DIAGNOSIS — Z1231 Encounter for screening mammogram for malignant neoplasm of breast: Secondary | ICD-10-CM

## 2020-05-06 DIAGNOSIS — R87612 Low grade squamous intraepithelial lesion on cytologic smear of cervix (LGSIL): Secondary | ICD-10-CM

## 2020-05-06 NOTE — Patient Instructions (Addendum)
Explained breast self awareness with Melinda Nielsen. Patient did not need a Pap smear today due to last Pap smear was 04/02/2020. Explained the colposcopy the recommended follow-up for her abnormal Pap smear. Referred patient to the [facility] for [place] for a colposcopy to follow-up for her abnormal Pap smear. Appointment scheduled Friday, May 14, 2020 at [place]. Referred patient to the [place] for a screening mammogram on the mobile unit. Appointment scheduled Thursday, May 06, 2020 at 1410. Patient escorted to mobile unit following BCCCP appointment for her screening mammogram. Patient aware of colposcopy appointment and will be there. Let patient know the [facility] will follow up with her within the next couple weeks with results of her mammogram by letter or phone. Discussed smoking cessation with patient. Referred to the [facility] Quitline and gave resources to the free smoking cessation classes at [facility]. Melinda Nielsen verbalized understanding.  Melinda Nielsen, Arvil Chaco, RN 1:35 PM

## 2020-05-06 NOTE — Progress Notes (Signed)
Melinda Nielsen is a 55 y.o. female who presents to [facility] clinic today with no complaints. Patient referred to [facility] by [facility] due to having an abnormal Pap smear 04/02/2020 that a colposcopy is recommended for follow-up.   Pap Smear: Pap smear not completed today. Last Pap smear was 04/02/2020 at [facility] clinic and was abnormal - LSIL with positive HPV. HPV typing for 16, 18, and 45 negative.. Per patient has history of an abnormal Pap smear over 8 years ago that no follow-up was completed. Last Pap smear result is available in Epic.   Physical exam: Breasts Breasts symmetrical. No skin abnormalities bilateral breasts. No nipple retraction bilateral breasts. No nipple discharge bilateral breasts. No lymphadenopathy. No lumps palpated bilateral breasts. No complaints of pain or tenderness on exam.       Pelvic/Bimanual Pap is not indicated today per BCCCP guidelines.   Smoking History: Patient is a current smoker. Discussed smoking cessation with patient. Referred to the [facility] Quitline and gave resources to the free smoking cessation classes at [facility].   Patient Navigation: Patient education provided. Access to services provided for patient through [place] program.   Colorectal Cancer Screening: Per patient had a colonoscopy completed 10 years ago and a polyp was found. No complaints today.    Breast and Cervical Cancer Risk Assessment: Patient does not have family history of a paternal aunt having breast cancer. Patient has no known genetic mutations or history of radiation treatment to the chest before age 30. Patient does not have history of cervical dysplasia, immunocompromised, or DES exposure in-utero.  Risk Assessment    Risk Scores      05/06/2020   Last edited by: Demetrius Revel, LPN   5-year risk: 1.4 %   Lifetime risk: 7.8 %          A: BCCCP exam without pap smear No complaints.  P: Referred patient to the [facility] for a screening mammogram on the  mobile unit. Appointment scheduled Thursday, May 06, 2020 at 1410.  Referred patient to the [facility] for [place] for a colposcopy to follow-up for her abnormal Pap smear. Appointment scheduled Friday, May 14, 2020 at [place].  Loletta Parish, RN 05/06/2020 1:35 PM

## 2020-05-10 ENCOUNTER — Other Ambulatory Visit: Payer: Self-pay | Admitting: Obstetrics and Gynecology

## 2020-05-10 DIAGNOSIS — R928 Other abnormal and inconclusive findings on diagnostic imaging of breast: Secondary | ICD-10-CM

## 2020-05-14 ENCOUNTER — Ambulatory Visit (INDEPENDENT_AMBULATORY_CARE_PROVIDER_SITE_OTHER): Payer: No Typology Code available for payment source | Admitting: Obstetrics and Gynecology

## 2020-05-14 ENCOUNTER — Encounter: Payer: Self-pay | Admitting: Obstetrics and Gynecology

## 2020-05-14 ENCOUNTER — Other Ambulatory Visit (HOSPITAL_COMMUNITY)
Admission: RE | Admit: 2020-05-14 | Discharge: 2020-05-14 | Disposition: A | Discharge status: Home / Self Care | Payer: No Typology Code available for payment source | Source: Ambulatory Visit | Attending: Obstetrics and Gynecology | Admitting: Obstetrics and Gynecology

## 2020-05-14 VITALS — BP 165/104 | HR 84 | Ht 66.0 in | Wt 216.7 lb

## 2020-05-14 DIAGNOSIS — R87612 Low grade squamous intraepithelial lesion on cytologic smear of cervix (LGSIL): Secondary | ICD-10-CM | POA: Insufficient documentation

## 2020-05-14 DIAGNOSIS — Z3202 Encounter for pregnancy test, result negative: Secondary | ICD-10-CM

## 2020-05-14 LAB — POCT PREGNANCY, URINE: Preg Test, Ur: NEGATIVE

## 2020-05-14 NOTE — Patient Instructions (Signed)
Colposcopy, Care After This sheet gives you information about how to care for yourself after your procedure. Your doctor may also give you more specific instructions. If you have problems or questions, contact your doctor. What can I expect after the procedure? If you did not have a sample of your tissue taken out (did not have a biopsy), you may only have some spotting of blood for a few days. You can go back to your normal activities. If you had a sample of your tissue taken out, it is common to have:  Soreness and mild pain. These may last for a few days.  A light-headed feeling.  Mild bleeding or fluid (discharge) coming from your vagina. The fluid will look dark and grainy. You may have this for a few days. The fluid may be caused by a liquid that was used during your procedure. You may need to wear a sanitary pad.  Spotting of blood for at least 48 hours after the procedure. Follow these instructions at home: Medicines  Take over-the-counter and prescription medicines only as told by your doctor.  Ask your doctor what medicines you can start taking again. This is very important if you take blood thinners. Activity  Limit your activity for the first day after your procedure as told by your doctor.  For at least 3 days, or for as long as told by your doctor, avoid: ? Douching. ? Using tampons. ? Having sex.  Return to your normal activities as told by your doctor. Ask your doctor what activities are safe for you. General instructions  Drink enough fluid to keep your pee (urine) pale yellow.  Ask your doctor if you may take baths, swim, or use a hot tub. You may take showers.  If you use birth control (contraception), keep using it.  Keep all follow-up visits as told by your doctor. This is important.   Contact a doctor if:  You get a skin rash. Get help right away if:  You bleed a lot from your vagina. A lot of bleeding means you use more than one pad an hour for 2  hours in a row.  You have clumps of blood (blood clots) coming from your vagina.  You have a fever or chills.  You have signs of infection. This may be fluid coming from your vagina that is: ? Different than normal. ? Yellow. ? Bad-smelling.  You have very bad pain or cramps in your lower belly that do not get better with medicine.  You faint. Summary  If you did not have a sample of your tissue taken out, you may only have some spotting of blood for a few days. You can go back to your normal activities.  If you had a sample of your tissue taken out, it is common to have mild pain for a few days and spotting for 48 hours.  Avoid douching, using tampons, and having sex for at least 3 days after the procedure or for as long as told.  Get help right away if you have a lot of bleeding, very bad pain, or signs of infection. This information is not intended to replace advice given to you by your health care provider. Make sure you discuss any questions you have with your health care provider. Document Revised: 11/04/2019 Document Reviewed: 01/01/2019 Elsevier Patient Education  2021 [employer].

## 2020-05-14 NOTE — Progress Notes (Signed)
    GYNECOLOGY CLINIC COLPOSCOPY PROCEDURE NOTE  55 y.o. No obstetric history on file. here for colposcopy for low-grade squamous intraepithelial neoplasia (LGSIL - encompassing HPV,mild dysplasia,CIN I) pap smear on 04/02/2020. Discussed role for HPV in cervical dysplasia, need for surveillance.  Patient given informed consent, signed copy in the chart, time out was performed.  Placed in lithotomy position. Cervix viewed with speculum and colposcope after application of acetic acid.   Colposcopy adequate? Yes  acetowhite lesion(s) noted at 12 and 6 o'clock; corresponding biopsies obtained.  ECC specimen obtained. Monsel's applied All specimens were labelled and sent to pathology.   Patient was given post procedure instructions.  Will follow up pathology and manage accordingly.  Routine preventative health maintenance measures emphasized.    Arlina Robes, MD, [place] Attending [place] for [place]

## 2020-05-17 LAB — SURGICAL PATHOLOGY

## 2020-05-19 ENCOUNTER — Other Ambulatory Visit: Payer: Self-pay | Admitting: Infectious Diseases

## 2020-05-19 ENCOUNTER — Telehealth: Payer: Self-pay | Admitting: Lactation Services

## 2020-05-19 DIAGNOSIS — B20 Human immunodeficiency virus [HIV] disease: Secondary | ICD-10-CM

## 2020-05-19 NOTE — Telephone Encounter (Signed)
Called patient to give results of Pap and recommendation for follow up Pap Smear in 1 year. Patient did not answer. Message was sent to patient in My Chart by Provider. LM for patient to check her My Chart message and to call the office with any questions or concerns.

## 2020-06-02 ENCOUNTER — Other Ambulatory Visit: Payer: No Typology Code available for payment source

## 2020-06-24 ENCOUNTER — Other Ambulatory Visit: Payer: No Typology Code available for payment source

## 2020-07-15 ENCOUNTER — Other Ambulatory Visit: Payer: Self-pay | Admitting: Infectious Diseases

## 2020-07-15 DIAGNOSIS — B20 Human immunodeficiency virus [HIV] disease: Secondary | ICD-10-CM

## 2021-05-13 ENCOUNTER — Ambulatory Visit (INDEPENDENT_AMBULATORY_CARE_PROVIDER_SITE_OTHER): Payer: Self-pay | Admitting: Internal Medicine

## 2021-05-13 ENCOUNTER — Encounter: Payer: Self-pay | Admitting: Internal Medicine

## 2021-05-13 ENCOUNTER — Other Ambulatory Visit: Payer: Self-pay

## 2021-05-13 VITALS — BP 172/114 | HR 94 | Temp 98.6°F | Ht 66.0 in | Wt 237.0 lb

## 2021-05-13 DIAGNOSIS — I10 Essential (primary) hypertension: Secondary | ICD-10-CM

## 2021-05-13 DIAGNOSIS — B20 Human immunodeficiency virus [HIV] disease: Secondary | ICD-10-CM

## 2021-05-13 DIAGNOSIS — Z113 Encounter for screening for infections with a predominantly sexual mode of transmission: Secondary | ICD-10-CM

## 2021-05-13 DIAGNOSIS — C530 Malignant neoplasm of endocervix: Secondary | ICD-10-CM

## 2021-05-13 MED ORDER — LOSARTAN POTASSIUM 25 MG PO TABS: 25.0000 mg | ORAL_TABLET | Freq: Every day | ORAL | 2 refills | Status: DC

## 2021-05-13 MED ORDER — BICTEGRAVIR-EMTRICITAB-TENOFOV 50-200-25 MG PO TABS: 1.0000 | ORAL_TABLET | Freq: Every day | ORAL | 3 refills | Status: DC

## 2021-05-13 NOTE — Patient Instructions (Signed)
Please do blood tests today ?Restart biktarvy today ? ?See me in 1 month after starting back on medication ? ?Referral to gynecology is placed for your history of cervical cancer ? ?Check your blood pressure once a day; start losartan 25 mg once a day ? ?---- ?Will give you referral information on primary care ?

## 2021-05-13 NOTE — Progress Notes (Signed)
? ?Subjective:  ? ? Patient ID: Melinda Nielsen, female  DOB: 1965/12/12, 56 y.o.        MRN: [id number] ? ? ?HPI ?56 yo F with hx of HIV+ since 2006 when pregnant (son -). She was previously on [place] but was lost to follow up. She has been seeing dr Johnnye Sima, and is now establishing care with new [facility] provider ? ? ?Patient said she just fell at work and hurt her right leg 03/2021. She stated she wants to get disability so she can take better care of herself, as she doesn't think taking meds/her medical conditions are conducive to taking care of her self properly ? ?#hiv ?Takes biktarvy. Has been on this from [place] 2 years. No issue with ART failure in the past ?She haven't been on medication for 9 months. This was due to stress of new job ? ?#hx abnormal pap ?Saw gynecology who did colposcopy -- didn't hear back from that. 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 She hasn't followed up ? ?#blood pressure ?She takes losartan  ?Today's bp is 170s/110s; she said it stays high ? ? ?Last 9 months not taking any meds ? ? ?#social ?-not currently working ?-lives with family members ?-"can't have any female" come to her house; she doesn't want letters communcation, only email/call ?-smoke 1/2 ppd since age 64; no etoh; no other substance use ?-no relationship at this time; last sexual encounter 9 months prior to 05/13/21 visit ? ? ? ?She last saw dr Johnnye Sima in 03/2020: ?----------------- ?She went to the ED 05-31-10 with 3 weeks of enlarging, painful right neck mass.  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  ?Her CD4 was 60, VL 43,300. Genotype background PR mutation (K20R,L33Vs).  ?She returned to ID clinic 06-08-10 and was restarted on atripla. She was eval at health dept and was started on anti-TB medications. She completed these on December 2012.  ?She returned with thrush and dysphagia January 2021, off ART since 2015.  ?She was started on biktarvy. She has been off meds (ADAP lapse 09-2019).  ? ?Had PAP  today.  ?Her BP is up today. Has been taking her rx, though not yet today.  ?Had work injury on her L knee, working on getting covered for this.  ?Has lost 5#, has been working on her diet.  ?Has decreased her smoking. 1/2 ppd. ?  ?HIV 1 RNA Quant (copies/mL)  ?Date Value  ?02/17/2020 <20 NOT DETECTED  ?01/27/2019 12,400 (H)  ?04/11/2013 <20  ? ?CD4 T Cell Abs (/uL)  ?Date Value  ?02/17/2020 273 (L)  ?01/27/2019 70 (L)  ?04/11/2013 260 (L)  ? ? ? ?Health Maintenance  ?Topic Date Due  ? TETANUS/TDAP  Never done  ? Zoster Vaccines- Shingrix (1 of 2) Never done  ? COLONOSCOPY (Pts 45-67yr Insurance coverage will need to be confirmed)  Never done  ? COVID-19 Vaccine (3 - Pfizer risk series) 08/14/2019  ? MAMMOGRAM  05/06/2021  ? PAP SMEAR-Modifier  04/02/2021  ? INFLUENZA VACCINE  08/16/2021  ? Hepatitis C Screening  Completed  ? HIV Screening  Completed  ? HPV VACCINES  Aged Out  ? ? ?ROS: ?All other ros negative ? ?   ?Objective:  ? ?Vitals:  ? 05/13/21 0918  ?BP: (!) 172/114  ?Pulse: 94  ?Temp: 98.6 ?F (37 ?C)  ?SpO2: 97%  ? ? ? ?Physical exam: ?General/constitutional: no distress, pleasant, obese ?HEENT: Normocephalic, PER, Conj Clear, EOMI, Oropharynx clear ?Neck supple ?  CV: rrr no mrg ?Lungs: clear to auscultation, normal respiratory effort ?Abd: Soft, Nontender ?Ext: trace edema ?Skin: No Rash ?Neuro: nonfocal ?MSK: no peripheral joint swelling/tenderness/warmth; back spines nontender ? ? ? ? ?   ?Assessment & Plan:  ?#hiv ?Off meds now 9 months as of 05/13/21, reestablishing care ?Previously on atripla-->biktarvy; no hx virologic failure ? ?-restart biktarvy ?-discussed u=u ?-encourage compliance ?-labs today ?-f/u in 4 weeks ? ? ?#hx abnormal pap/cin ?04/2020 colposcopy with cin. Lost to f/u ? ?-refer back to gynecology ? ? ?#htn ?Off meds as with hiv meds and losartan  ? ?-restart losartan 25 mg daily ?-referral information for primary care clinic given ? ? ?#social ?She mentions a lot wanting to get on  disability. She didn't ask me anything specific to do. She said she wants to take better care of herself and working is preventing that ? ?-will see what she needs moving forward ? ? ?#hcm to discuss future visits ?-vaccination ?-std screen ?-hepatitis screen ?-cancer screen ?-tb screen ? ? ? ? ? ?I have spent a total of 45 minutes of face-to-face and non-face-to-face time, excluding clinical staff time, preparing to see patient, ordering tests and/or medications, and provide counseling the patient ? ? ? ? ?Jabier Mutton, MD ?[facility] ?[place] ?[phone]  pager   [phone] cell ?05/13/2021, 12:20 PM ? ?

## 2021-05-17 ENCOUNTER — Telehealth: Payer: Self-pay

## 2021-05-17 NOTE — Telephone Encounter (Signed)
Patient left a voicemail requesting call back to go over lab results. Attempted to reach patient back, but call was not answered. Voicemail is full. ?Leatrice Jewels, RMA  ?

## 2021-05-19 NOTE — Telephone Encounter (Signed)
Called patient regarding voicemail requesting to review labs. Relayed Dr. Hart [place] message about labs. Scheduled appointment for repeat labs on 5/22. ?Leatrice Jewels, RMA  ? ?

## 2021-05-28 LAB — COMPLETE METABOLIC PANEL WITH GFR
AG Ratio: 1.1 (calc) (ref 1.0–2.5)
ALT: 14 U/L (ref 6–29)
AST: 17 U/L (ref 10–35)
Albumin: 4.1 g/dL (ref 3.6–5.1)
Alkaline phosphatase (APISO): 97 U/L (ref 37–153)
BUN: 11 mg/dL (ref 7–25)
CO2: 24 mmol/L (ref 20–32)
Calcium: 8.9 mg/dL (ref 8.6–10.4)
Chloride: 107 mmol/L (ref 98–110)
Creat: 0.58 mg/dL (ref 0.50–1.03)
Globulin: 3.8 g/dL (calc) — ABNORMAL HIGH (ref 1.9–3.7)
Glucose, Bld: 111 mg/dL — ABNORMAL HIGH (ref 65–99)
Potassium: 3.5 mmol/L (ref 3.5–5.3)
Sodium: 140 mmol/L (ref 135–146)
Total Bilirubin: 0.4 mg/dL (ref 0.2–1.2)
Total Protein: 7.9 g/dL (ref 6.1–8.1)
eGFR: 106 mL/min/{1.73_m2} (ref >=60)

## 2021-05-28 LAB — HIV-1 INTEGRASE GENOTYPE

## 2021-05-28 LAB — CBC
HCT: 40.5 % (ref 35.0–45.0)
Hemoglobin: 13.2 g/dL (ref 11.7–15.5)
MCH: 27.4 pg (ref 27.0–33.0)
MCHC: 32.6 g/dL (ref 32.0–36.0)
MCV: 84.2 fL (ref 80.0–100.0)
MPV: 12 fL (ref 7.5–12.5)
Platelets: 222 10*3/uL (ref 140–400)
RBC: 4.81 10*6/uL (ref 3.80–5.10)
RDW: 13.8 % (ref 11.0–15.0)
WBC: 3.8 10*3/uL (ref 3.8–10.8)

## 2021-05-28 LAB — HIV RNA, RTPCR W/R GT (RTI, PI,INT)
HIV 1 RNA Quant: 28400 copies/mL — ABNORMAL HIGH
HIV-1 RNA Quant, Log: 4.45 Log copies/mL — ABNORMAL HIGH

## 2021-05-28 LAB — T-HELPER CELLS (CD4) COUNT (NOT AT ARMC)
Absolute CD4: 140 cells/uL — ABNORMAL LOW (ref 490–1740)
CD4 T Helper %: 11 % — ABNORMAL LOW (ref 30–61)
Total lymphocyte count: 1319 cells/uL (ref 850–3900)

## 2021-05-28 LAB — HIV-1 GENOTYPE: HIV-1 Genotype: DETECTED — AB

## 2021-05-28 LAB — RPR: RPR Ser Ql: NONREACTIVE

## 2021-05-28 LAB — HIV-1 RNA QUANT-NO REFLEX-BLD
HIV 1 RNA Quant: 31100 copies/mL — ABNORMAL HIGH
HIV-1 RNA Quant, Log: 4.49 Log copies/mL — ABNORMAL HIGH

## 2021-06-06 ENCOUNTER — Other Ambulatory Visit: Payer: Self-pay

## 2021-06-06 DIAGNOSIS — Z79899 Other long term (current) drug therapy: Secondary | ICD-10-CM

## 2021-06-06 DIAGNOSIS — B20 Human immunodeficiency virus [HIV] disease: Secondary | ICD-10-CM

## 2021-06-06 DIAGNOSIS — Z113 Encounter for screening for infections with a predominantly sexual mode of transmission: Secondary | ICD-10-CM

## 2021-06-27 ENCOUNTER — Encounter: Payer: Self-pay | Admitting: Infectious Diseases

## 2021-08-01 ENCOUNTER — Telehealth: Payer: Self-pay

## 2021-08-01 ENCOUNTER — Other Ambulatory Visit: Payer: Self-pay

## 2021-08-01 NOTE — Telephone Encounter (Signed)
Detectable viral load, called patient to schedule follow up appointment, no answer. Left HIPAA compliant voicemail requesting callback.   Beryle Flock, RN

## 2021-08-16 ENCOUNTER — Other Ambulatory Visit: Payer: Self-pay | Admitting: Internal Medicine

## 2021-08-16 NOTE — Telephone Encounter (Signed)
Ok to refill 

## 2021-08-31 ENCOUNTER — Ambulatory Visit: Payer: Self-pay

## 2021-09-14 ENCOUNTER — Ambulatory Visit: Payer: Self-pay | Admitting: Internal Medicine

## 2021-09-17 ENCOUNTER — Other Ambulatory Visit: Payer: Self-pay | Admitting: Internal Medicine

## 2021-09-17 ENCOUNTER — Other Ambulatory Visit: Payer: Self-pay | Admitting: Infectious Diseases

## 2021-10-08 ENCOUNTER — Other Ambulatory Visit: Payer: Self-pay | Admitting: Infectious Diseases

## 2021-10-10 NOTE — Telephone Encounter (Signed)
Patient needs PCP for HTN management per provider.    Beryle Flock, RN

## 2021-11-17 ENCOUNTER — Ambulatory Visit (INDEPENDENT_AMBULATORY_CARE_PROVIDER_SITE_OTHER): Payer: Self-pay | Admitting: Pharmacist

## 2021-11-17 ENCOUNTER — Other Ambulatory Visit: Payer: Self-pay | Admitting: Pharmacist

## 2021-11-17 ENCOUNTER — Other Ambulatory Visit: Payer: Self-pay

## 2021-11-17 DIAGNOSIS — Z23 Encounter for immunization: Secondary | ICD-10-CM

## 2021-11-17 DIAGNOSIS — B20 Human immunodeficiency virus [HIV] disease: Secondary | ICD-10-CM

## 2021-11-17 MED ORDER — SERTRALINE HCL 50 MG PO TABS: 50.0000 mg | ORAL_TABLET | Freq: Every day | ORAL | 5 refills | Status: DC

## 2021-11-17 MED ORDER — ROSUVASTATIN CALCIUM 10 MG PO TABS: 10.0000 mg | ORAL_TABLET | Freq: Every day | ORAL | 5 refills | Status: DC
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
## 2021-11-18 LAB — T-HELPER CELLS (CD4) COUNT (NOT AT ARMC)
CD4 % Helper T Cell: 11 % — ABNORMAL LOW (ref 33–65)
CD4 T Cell Abs: 276 /uL — ABNORMAL LOW (ref 400–1790)

## 2021-11-21 ENCOUNTER — Telehealth: Payer: Self-pay

## 2021-11-21 ENCOUNTER — Other Ambulatory Visit: Payer: Self-pay

## 2021-11-21 ENCOUNTER — Other Ambulatory Visit: Payer: Self-pay | Admitting: Pharmacist

## 2021-11-21 DIAGNOSIS — B20 Human immunodeficiency virus [HIV] disease: Secondary | ICD-10-CM

## 2021-11-21 DIAGNOSIS — Z2989 Encounter for other specified prophylactic measures: Secondary | ICD-10-CM

## 2021-11-21 LAB — LIPID PANEL
Cholesterol: 195 mg/dL (ref <200)
HDL: 58 mg/dL (ref >=50)
LDL Cholesterol (Calc): 120 mg/dL (calc) — ABNORMAL HIGH
Non-HDL Cholesterol (Calc): 137 mg/dL (calc) — ABNORMAL HIGH (ref <130)
Total CHOL/HDL Ratio: 3.4 (calc) (ref <5.0)
Triglycerides: 74 mg/dL (ref <150)

## 2021-11-21 LAB — RPR: RPR Ser Ql: NONREACTIVE

## 2021-11-21 LAB — HIV RNA, RTPCR W/R GT (RTI, PI,INT)
HIV 1 RNA Quant: NOT DETECTED copies/mL
HIV-1 RNA Quant, Log: NOT DETECTED Log copies/mL

## 2021-11-21 MED ORDER — SULFAMETHOXAZOLE-TRIMETHOPRIM 800-160 MG PO TABS: 1.0000 | ORAL_TABLET | Freq: Every day | ORAL | 5 refills | Status: DC

## 2021-11-21 MED ORDER — BICTEGRAVIR-EMTRICITAB-TENOFOV 50-200-25 MG PO TABS: 1.0000 | ORAL_TABLET | Freq: Every day | ORAL | 3 refills | Status: DC

## 2021-11-21 NOTE — Telephone Encounter (Signed)
Sure thing. We gave her a month's worth of Bactrim when she was in clinic. Liane Comber will send Rx now. Thanks!

## 2021-11-21 NOTE — Telephone Encounter (Signed)
Patient called requesting refills of Biktarvy. Also states the pharmacy did not receive a prescription for Bactrim. Biktarvy refills sent.   Beryle Flock, RN

## 2021-12-20 ENCOUNTER — Ambulatory Visit (INDEPENDENT_AMBULATORY_CARE_PROVIDER_SITE_OTHER): Payer: Self-pay | Admitting: Pharmacist

## 2021-12-20 ENCOUNTER — Other Ambulatory Visit: Payer: Self-pay

## 2021-12-20 ENCOUNTER — Ambulatory Visit: Payer: No Typology Code available for payment source | Admitting: Pharmacist

## 2021-12-20 DIAGNOSIS — B20 Human immunodeficiency virus [HIV] disease: Secondary | ICD-10-CM
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
## 2021-12-21 ENCOUNTER — Other Ambulatory Visit: Payer: Self-pay | Admitting: Pharmacist

## 2021-12-21 DIAGNOSIS — I10 Essential (primary) hypertension: Secondary | ICD-10-CM

## 2021-12-21 MED ORDER — LOSARTAN POTASSIUM 25 MG PO TABS: ORAL_TABLET | ORAL | 5 refills | Status: DC

## 2021-12-26 ENCOUNTER — Ambulatory Visit: Payer: No Typology Code available for payment source | Admitting: Obstetrics and Gynecology

## 2022-03-21 ENCOUNTER — Ambulatory Visit: Payer: No Typology Code available for payment source | Admitting: Pharmacist

## 2022-04-04 ENCOUNTER — Telehealth: Payer: Self-pay

## 2022-04-04 NOTE — Telephone Encounter (Signed)
Patient called requesting referral to [facility] - patient was recently diagnosed with ovarian cancer.   [place], CMA

## 2022-04-19 ENCOUNTER — Encounter: Payer: Self-pay | Admitting: Obstetrics and Gynecology

## 2022-04-19 ENCOUNTER — Ambulatory Visit: Payer: Medicaid Other | Admitting: Obstetrics and Gynecology

## 2022-04-19 ENCOUNTER — Other Ambulatory Visit (HOSPITAL_COMMUNITY)
Admission: RE | Admit: 2022-04-19 | Discharge: 2022-04-19 | Disposition: A | Discharge status: Home / Self Care | Payer: Medicaid Other | Source: Ambulatory Visit | Attending: Obstetrics and Gynecology | Admitting: Obstetrics and Gynecology

## 2022-04-19 VITALS — BP 171/112 | HR 102 | Ht 63.0 in | Wt 252.0 lb

## 2022-04-19 DIAGNOSIS — Z01419 Encounter for gynecological examination (general) (routine) without abnormal findings: Secondary | ICD-10-CM | POA: Insufficient documentation

## 2022-04-19 DIAGNOSIS — R87612 Low grade squamous intraepithelial lesion on cytologic smear of cervix (LGSIL): Secondary | ICD-10-CM | POA: Diagnosis not present

## 2022-04-19 NOTE — Progress Notes (Signed)
Patient presents for AEX. Last Pap: 04/02/2020 LSIL Last Mammogram: 05/06/2020 Vaginal/Urinary Symptoms: Denies STD Screen: Declines Other Concerns: lump in right axilla
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
## 2022-04-25 ENCOUNTER — Ambulatory Visit (INDEPENDENT_AMBULATORY_CARE_PROVIDER_SITE_OTHER): Payer: Medicaid Other | Admitting: Pharmacist

## 2022-04-25 ENCOUNTER — Other Ambulatory Visit: Payer: Self-pay | Admitting: Obstetrics and Gynecology

## 2022-04-25 ENCOUNTER — Other Ambulatory Visit: Payer: Self-pay

## 2022-04-25 ENCOUNTER — Other Ambulatory Visit: Payer: Self-pay | Admitting: Pharmacist

## 2022-04-25 DIAGNOSIS — I10 Essential (primary) hypertension: Secondary | ICD-10-CM

## 2022-04-25 DIAGNOSIS — Z2989 Encounter for other specified prophylactic measures: Secondary | ICD-10-CM

## 2022-04-25 DIAGNOSIS — Z01419 Encounter for gynecological examination (general) (routine) without abnormal findings: Secondary | ICD-10-CM

## 2022-04-25 DIAGNOSIS — R87612 Low grade squamous intraepithelial lesion on cytologic smear of cervix (LGSIL): Secondary | ICD-10-CM

## 2022-04-25 DIAGNOSIS — N6310 Unspecified lump in the right breast, unspecified quadrant: Secondary | ICD-10-CM

## 2022-04-25 DIAGNOSIS — B20 Human immunodeficiency virus [HIV] disease: Secondary | ICD-10-CM | POA: Diagnosis not present

## 2022-04-25 MED ORDER — BICTEGRAVIR-EMTRICITAB-TENOFOV 50-200-25 MG PO TABS
1.0000 | ORAL_TABLET | Freq: Every day | ORAL | 11 refills | Status: DC
Start: 2022-04-25 — End: 2022-05-29

## 2022-04-25 MED ORDER — SERTRALINE HCL 50 MG PO TABS: 50.0000 mg | ORAL_TABLET | Freq: Every day | ORAL | 11 refills | Status: DC

## 2022-04-25 MED ORDER — LOSARTAN POTASSIUM 25 MG PO TABS: ORAL_TABLET | ORAL | 11 refills | Status: DC

## 2022-04-25 MED ORDER — SULFAMETHOXAZOLE-TRIMETHOPRIM 800-160 MG PO TABS
1.0000 | ORAL_TABLET | Freq: Every day | ORAL | 0 refills | Status: AC
Start: 2022-04-25 — End: ?

## 2022-04-25 MED ORDER — ROSUVASTATIN CALCIUM 10 MG PO TABS: 10.0000 mg | ORAL_TABLET | Freq: Every day | ORAL | 11 refills | Status: DC
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
## 2022-04-25 NOTE — Progress Notes (Signed)
Sent in refills for patients losartan, bactrim, Biktarvy, crestor., and sertraline.

## 2022-04-26 LAB — CYTOLOGY - PAP
Comment: NEGATIVE
Diagnosis: UNDETERMINED — AB
High risk HPV: NEGATIVE

## 2022-04-26 LAB — T-HELPER CELL (CD4) - (RCID CLINIC ONLY)
CD4 % Helper T Cell: 12 % — ABNORMAL LOW (ref 33–65)
CD4 T Cell Abs: 285 /uL — ABNORMAL LOW (ref 400–1790)

## 2022-04-29 LAB — HIV-1 RNA QUANT-NO REFLEX-BLD
HIV 1 RNA Quant: NOT DETECTED Copies/mL
HIV-1 RNA Quant, Log: NOT DETECTED Log cps/mL

## 2022-05-17 ENCOUNTER — Other Ambulatory Visit: Payer: Medicaid Other

## 2022-05-18 ENCOUNTER — Ambulatory Visit
Admission: RE | Admit: 2022-05-18 | Discharge: 2022-05-18 | Disposition: A | Discharge status: Home / Self Care | Payer: Medicaid Other | Source: Ambulatory Visit | Attending: Obstetrics and Gynecology | Admitting: Obstetrics and Gynecology

## 2022-05-18 ENCOUNTER — Ambulatory Visit: Payer: Medicaid Other

## 2022-05-29 ENCOUNTER — Other Ambulatory Visit: Payer: Self-pay | Admitting: Internal Medicine

## 2022-05-29 DIAGNOSIS — B20 Human immunodeficiency virus [HIV] disease: Secondary | ICD-10-CM

## 2022-07-26 ENCOUNTER — Encounter (INDEPENDENT_AMBULATORY_CARE_PROVIDER_SITE_OTHER): Payer: Self-pay

## 2022-07-26 ENCOUNTER — Encounter (INDEPENDENT_AMBULATORY_CARE_PROVIDER_SITE_OTHER): Payer: Medicaid Other | Admitting: Family Medicine

## 2022-08-03 ENCOUNTER — Encounter: Payer: Self-pay | Admitting: Internal Medicine

## 2022-08-03 ENCOUNTER — Ambulatory Visit (INDEPENDENT_AMBULATORY_CARE_PROVIDER_SITE_OTHER): Payer: Medicaid Other | Admitting: Internal Medicine

## 2022-08-03 ENCOUNTER — Other Ambulatory Visit: Payer: Self-pay

## 2022-08-03 VITALS — BP 160/89 | HR 89 | Resp 16 | Ht 63.0 in | Wt 253.0 lb

## 2022-08-03 DIAGNOSIS — Z113 Encounter for screening for infections with a predominantly sexual mode of transmission: Secondary | ICD-10-CM

## 2022-08-03 DIAGNOSIS — B20 Human immunodeficiency virus [HIV] disease: Secondary | ICD-10-CM

## 2022-08-03 DIAGNOSIS — Z1159 Encounter for screening for other viral diseases: Secondary | ICD-10-CM | POA: Diagnosis not present

## 2022-08-03 DIAGNOSIS — Z131 Encounter for screening for diabetes mellitus: Secondary | ICD-10-CM | POA: Diagnosis not present

## 2022-08-03 LAB — CBC
Hemoglobin: 13.1 g/dL (ref 11.7–15.5)
MCV: 83.8 fL (ref 80.0–100.0)
RBC: 4.74 10*6/uL (ref 3.80–5.10)
RDW: 13.2 % (ref 11.0–15.0)

## 2022-08-03 NOTE — Addendum Note (Signed)
Addended by: Harley Alto on: 08/03/2022 04:45 PM   Modules accepted: Orders

## 2022-08-03 NOTE — Patient Instructions (Signed)
Please set up a PCP ([facility]). They'll do your blood pressure, other medical issues, diabetes...  Your viral load is negative  Your last pap is still somewhat abnormal. Make sure you do another by end of this year   See me in 6 months

## 2022-08-03 NOTE — Addendum Note (Signed)
Addended by: Harley Alto on: 08/03/2022 04:25 PM   Modules accepted: Orders

## 2022-08-03 NOTE — Progress Notes (Signed)
Subjective:    Patient ID: Melinda Nielsen, female  DOB: 30-Apr-1965, 57 y.o.        MRN: [id number]   HPI 57 yo F with hx of HIV+ since 2006 when pregnant (son -). She was previously on [place] but was lost to follow up. She has been seeing dr Ninetta Lights, and is now establishing care with new [facility] provider    Patient had seen our pharmacy team last seen 04/2022 but not yet with me since 04/2021 Doing well on biktarvy; no missed dose last 4 weeks Hiv virologically controlled since 11/2021 Due for all labs today Discussed pap from 04/2022 [facility] Advise her to get pcp  Not sexually active; single; doesn't think she has any std but agrees to screening    Initial visit with me 04/2021 ---------  Patient said she just fell at work and hurt her right leg 03/2021. She stated she wants to get disability so she can take better care of herself, as she doesn't think taking meds/her medical conditions are conducive to taking care of her self properly  #hiv Takes biktarvy. Has been on this from [place] 2 years. No issue with ART failure in the past She haven't been on medication for 9 months. This was due to stress of new job  #hx abnormal pap Saw gynecology who did colposcopy -- didn't hear back from that. 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 She hasn't followed up  #blood pressure She takes losartan  Today's bp is 170s/110s; she said it stays high   Last 9 months not taking any meds   #social -not currently working -lives with family members -"can't have any female" come to her house; she doesn't want letters communcation, only email/call -smoke 1/2 ppd since age 41; no etoh; no other substance use -no relationship at this time; last sexual encounter 9 months prior to 05/13/21 visit    She last saw dr Ninetta Lights in 03/2020: ----------------- She went to the ED 05-31-10 with 3 weeks of enlarging, painful right neck mass.  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  Her  CD4 was 60, VL 43,300. Genotype background PR mutation (K20R,L33Vs).  She returned to ID clinic 06-08-10 and was restarted on atripla. She was eval at health dept and was started on anti-TB medications. She completed these on December 2012.  She returned with thrush and dysphagia January 2021, off ART since 2015.  She was started on biktarvy. She has been off meds (ADAP lapse 09-2019).   Had PAP today.  Her BP is up today. Has been taking her rx, though not yet today.  Had work injury on her L knee, working on getting covered for this.  Has lost 5#, has been working on her diet.  Has decreased her smoking. 1/2 ppd.   HIV 1 RNA Quant  Date Value  04/25/2022 Not Detected Copies/mL  11/17/2021 NOT DETECTED copies/mL  05/13/2021 28,400 copies/mL (H)  05/13/2021 31,100 copies/mL (H)   CD4 T Cell Abs (/uL)  Date Value  04/25/2022 285 (L)  11/17/2021 276 (L)  02/17/2020 273 (L)     Health Maintenance  Topic Date Due   Zoster Vaccines- Shingrix (1 of 2) Never done   Colonoscopy  Never done   COVID-19 Vaccine (3 - Pfizer risk series) 08/14/2019   INFLUENZA VACCINE  08/17/2022   PAP SMEAR-Modifier  04/19/2023   MAMMOGRAM  05/18/2023   DTaP/Tdap/Td (2 - Td or Tdap) 11/18/2031   Hepatitis C  Screening  Completed   HIV Screening  Completed   HPV VACCINES  Aged Out    ROS: All other ros negative     Objective:   Vitals:   08/03/22 1536  BP: (!) 160/89  Pulse: 89  Resp: 16  SpO2: 98%     Physical exam: General/constitutional: no distress, pleasant, obese HEENT: Normocephalic, PER, Conj Clear, EOMI, Oropharynx clear Neck supple CV: rrr no mrg Lungs: clear to auscultation, normal respiratory effort Abd: Soft, Nontender Ext: trace edema Skin: No Rash Neuro: nonfocal MSK: no peripheral joint swelling/tenderness/warmth; back spines nontender        Assessment & Plan:  #hiv Off meds now 9 months as of 05/13/21, reestablishing care Previously on atripla-->biktarvy; no  hx virologic failure   07/2022 doing well on biktarvy and wants to stick with it     -discussed u=u -encourage compliance -continue current HIV medication -labs today -f/u in 6 months   Will discuss statin next visit       #hx abnormal pap/cin 04/2022 [facility]; advise to repeat end of year  -f/u gynecology   #htn Off meds as with hiv meds and losartan   -advise to establish pcp care   #screening for dm2  -A1c    #hcm to discuss future visits -vaccination Hep b non-conj vaccine in 2015 Meningococcal 2023 booster Prevnar13 2022 Tdap 2023 -std screen 03/2020 cervical and urine gc chlam negative; resend 07/2022 urine study 11/2021 rpr negative; resend fta/rpr 07/2022 -hepatitis screen 2021 showed prior hepatitis b infection 2021 hep c ab negative Resend hepatitis labs today 07/2022 -cancer screen Advise establishing pcp to have age appropriate cancer screening including breast, colon, and cervical cancer -tb screen Negative quantiferon 2021        Raymondo Band, MD [facility] [phone]  pager   [phone] cell 08/03/2022, 3:45 PM

## 2022-08-04 LAB — COMPLETE METABOLIC PANEL WITH GFR
AG Ratio: 1.3 (calc) (ref 1.0–2.5)
ALT: 10 U/L (ref 6–29)
AST: 12 U/L (ref 10–35)
BUN: 13 mg/dL (ref 7–25)
CO2: 28 mmol/L (ref 20–32)
Glucose, Bld: 95 mg/dL (ref 65–99)
Potassium: 3.7 mmol/L (ref 3.5–5.3)
Sodium: 142 mmol/L (ref 135–146)
Total Bilirubin: 0.6 mg/dL (ref 0.2–1.2)
eGFR: 104 mL/min/{1.73_m2} (ref >=60)

## 2022-08-04 LAB — HEPATITIS C ANTIBODY: Hepatitis C Ab: NONREACTIVE

## 2022-08-04 LAB — HEPATITIS B SURFACE ANTIGEN: Hepatitis B Surface Ag: NONREACTIVE

## 2022-08-04 LAB — HEMOGLOBIN A1C
Mean Plasma Glucose: 131 mg/dL
eAG (mmol/L): 7.3 mmol/L

## 2022-08-05 LAB — RPR: RPR Ser Ql: NONREACTIVE

## 2022-08-06 LAB — HEMOGLOBIN A1C: Hgb A1c MFr Bld: 6.2 % of total Hgb — ABNORMAL HIGH (ref <5.7)

## 2022-08-06 LAB — CBC
HCT: 39.7 % (ref 35.0–45.0)
MCH: 27.6 pg (ref 27.0–33.0)
MCHC: 33 g/dL (ref 32.0–36.0)
MPV: 11.6 fL (ref 7.5–12.5)
Platelets: 268 10*3/uL (ref 140–400)
WBC: 8.9 10*3/uL (ref 3.8–10.8)

## 2022-08-06 LAB — HIV-1 RNA QUANT-NO REFLEX-BLD
HIV 1 RNA Quant: NOT DETECTED Copies/mL
HIV-1 RNA Quant, Log: NOT DETECTED Log cps/mL

## 2022-08-06 LAB — COMPLETE METABOLIC PANEL WITH GFR
Albumin: 4.1 g/dL (ref 3.6–5.1)
Alkaline phosphatase (APISO): 115 U/L (ref 37–153)
Calcium: 9.2 mg/dL (ref 8.6–10.4)
Chloride: 108 mmol/L (ref 98–110)
Creat: 0.62 mg/dL (ref 0.50–1.03)
Globulin: 3.2 g/dL (calc) (ref 1.9–3.7)
Total Protein: 7.3 g/dL (ref 6.1–8.1)

## 2022-08-06 LAB — HEPATITIS B DNA, ULTRAQUANTITATIVE, PCR
Hepatitis B DNA: NOT DETECTED IU/mL
Hepatitis B virus DNA: NOT DETECTED Log IU/mL

## 2022-08-06 LAB — HEPATITIS B SURFACE ANTIBODY, QUANTITATIVE: Hep B S AB Quant (Post): >1000 m[IU]/mL (ref >=10)

## 2022-09-25 ENCOUNTER — Other Ambulatory Visit: Payer: Self-pay | Admitting: Pharmacist

## 2022-09-25 ENCOUNTER — Other Ambulatory Visit: Payer: Self-pay

## 2022-09-25 DIAGNOSIS — I10 Essential (primary) hypertension: Secondary | ICD-10-CM

## 2022-09-25 DIAGNOSIS — Z2989 Encounter for other specified prophylactic measures: Secondary | ICD-10-CM

## 2022-09-25 MED ORDER — SERTRALINE HCL 50 MG PO TABS: 50.0000 mg | ORAL_TABLET | Freq: Every day | ORAL | 5 refills | Status: DC

## 2022-09-25 MED ORDER — LOSARTAN POTASSIUM 25 MG PO TABS: ORAL_TABLET | ORAL | 5 refills | Status: DC

## 2022-09-25 MED ORDER — ROSUVASTATIN CALCIUM 10 MG PO TABS: 10.0000 mg | ORAL_TABLET | Freq: Every day | ORAL | 5 refills | Status: DC

## 2022-09-25 NOTE — Telephone Encounter (Signed)
Refills on losartan, rosuvastatin and sertraline sent to corrected pharmacy ([facility] on [address]). Per Dr.Vu patient doesn't need to continue taking Bactrim. Left voicemail asking patient to return my call to make her aware.    Keimon Basaldua Lesli Albee, CMA

## 2022-09-27 ENCOUNTER — Encounter (HOSPITAL_COMMUNITY): Payer: Self-pay

## 2022-09-27 ENCOUNTER — Other Ambulatory Visit: Payer: Self-pay

## 2022-09-27 ENCOUNTER — Emergency Department (HOSPITAL_COMMUNITY)
Admission: EM | Admit: 2022-09-27 | Discharge: 2022-09-27 | Disposition: A | Discharge status: Home / Self Care | Payer: Medicaid Other | Attending: Emergency Medicine | Admitting: Emergency Medicine

## 2022-09-27 ENCOUNTER — Emergency Department (HOSPITAL_BASED_OUTPATIENT_CLINIC_OR_DEPARTMENT_OTHER): Payer: Medicaid Other

## 2022-09-27 DIAGNOSIS — M25561 Pain in right knee: Secondary | ICD-10-CM | POA: Insufficient documentation

## 2022-09-27 DIAGNOSIS — R531 Weakness: Secondary | ICD-10-CM | POA: Diagnosis not present

## 2022-09-27 DIAGNOSIS — M7989 Other specified soft tissue disorders: Secondary | ICD-10-CM | POA: Diagnosis present

## 2022-09-27 DIAGNOSIS — Z79899 Other long term (current) drug therapy: Secondary | ICD-10-CM | POA: Diagnosis not present

## 2022-09-27 DIAGNOSIS — R600 Localized edema: Secondary | ICD-10-CM | POA: Diagnosis not present

## 2022-09-27 DIAGNOSIS — M79662 Pain in left lower leg: Secondary | ICD-10-CM

## 2022-09-27 DIAGNOSIS — M25562 Pain in left knee: Secondary | ICD-10-CM | POA: Insufficient documentation

## 2022-09-27 LAB — CBC WITH DIFFERENTIAL/PLATELET
Abs Immature Granulocytes: 0.02 10*3/uL (ref 0.00–0.07)
Basophils Absolute: 0.1 10*3/uL (ref 0.0–0.1)
Basophils Relative: 1 %
Eosinophils Absolute: 0.2 10*3/uL (ref 0.0–0.5)
Eosinophils Relative: 3 %
HCT: 37.8 % (ref 36.0–46.0)
Hemoglobin: 12.2 g/dL (ref 12.0–15.0)
Immature Granulocytes: 0 %
Lymphocytes Relative: 40 %
Lymphs Abs: 3 10*3/uL (ref 0.7–4.0)
MCH: 27.6 pg (ref 26.0–34.0)
MCHC: 32.3 g/dL (ref 30.0–36.0)
MCV: 85.5 fL (ref 80.0–100.0)
Monocytes Absolute: 0.6 10*3/uL (ref 0.1–1.0)
Monocytes Relative: 8 %
Neutro Abs: 3.6 10*3/uL (ref 1.7–7.7)
Neutrophils Relative %: 48 %
Platelets: 286 10*3/uL (ref 150–400)
RBC: 4.42 MIL/uL (ref 3.87–5.11)
RDW: 14.6 % (ref 11.5–15.5)
WBC: 7.5 10*3/uL (ref 4.0–10.5)
nRBC: 0 % (ref 0.0–0.2)

## 2022-09-27 LAB — COMPREHENSIVE METABOLIC PANEL
ALT: 12 U/L (ref 0–44)
AST: 18 U/L (ref 15–41)
Albumin: 3.7 g/dL (ref 3.5–5.0)
Alkaline Phosphatase: 109 U/L (ref 38–126)
Anion gap: 9 (ref 5–15)
BUN: 14 mg/dL (ref 6–20)
CO2: 27 mmol/L (ref 22–32)
Calcium: 8.8 mg/dL — ABNORMAL LOW (ref 8.9–10.3)
Chloride: 103 mmol/L (ref 98–111)
Creatinine, Ser: 0.7 mg/dL (ref 0.44–1.00)
GFR, Estimated: >60 mL/min (ref >60)
Glucose, Bld: 93 mg/dL (ref 70–99)
Potassium: 3.3 mmol/L — ABNORMAL LOW (ref 3.5–5.1)
Sodium: 139 mmol/L (ref 135–145)
Total Bilirubin: 0.3 mg/dL (ref 0.3–1.2)
Total Protein: 7.5 g/dL (ref 6.5–8.1)

## 2022-09-27 MED ORDER — PREDNISONE 10 MG PO TABS: 20.0000 mg | ORAL_TABLET | Freq: Every day | ORAL | 0 refills | Status: AC

## 2022-09-27 MED ORDER — FUROSEMIDE 20 MG PO TABS: ORAL_TABLET | ORAL | 0 refills | Status: AC

## 2022-09-27 NOTE — ED Notes (Signed)
Urine sent to the lab

## 2022-09-27 NOTE — ED Triage Notes (Signed)
Pt complaining of bilateral leg from the knee down. Pt has hx of swelling in the right leg, but for the last 1.5 wks both legs have been swelling, with increased swelling in the right. Endorses pain in both legs, and redness on the right thigh. Denies any SOB.

## 2022-09-27 NOTE — ED Provider Notes (Signed)
[place] EMERGENCY DEPARTMENT AT [facility] Provider Note   CSN: [id number] Arrival date & time: 09/27/22  1432     History  Chief Complaint  Patient presents with   Leg Swelling    Melinda Nielsen is a 57 y.o. female.  Patient complains of swelling in both legs and pain in her legs.  Patient has a history of chronic orthopedic problems with her legs.  The history is provided by the patient and medical records.  Weakness Severity:  Mild Onset quality:  Sudden Timing:  Intermittent Progression:  Waxing and waning Chronicity:  Recurrent Context: not alcohol use   Relieved by:  Nothing Worsened by:  Nothing Ineffective treatments:  None tried Associated symptoms: no abdominal pain, no chest pain, no cough, no diarrhea, no frequency, no headaches and no seizures        Home Medications Prior to Admission medications   Medication Sig Start Date End Date Taking? Authorizing Provider  furosemide (LASIX) 20 MG tablet Take 1 pill every other day for 3 days 09/27/22  Yes Bethann Berkshire, MD  predniSONE (DELTASONE) 10 MG tablet Take 2 tablets (20 mg total) by mouth daily. 09/27/22  Yes Bethann Berkshire, MD  BIKTARVY 50-200-25 MG TABS tablet TAKE 1 TABLET BY MOUTH DAILY 05/29/22   Vu, Gershon Mussel T, MD  IBUPROFEN PO Take by mouth.    [provider]  losartan (COZAAR) 25 MG tablet TAKE 1 TABLET(25 MG) BY MOUTH DAILY 09/25/22   Vu, Trung T, MD  rosuvastatin (CRESTOR) 10 MG tablet Take 1 tablet (10 mg total) by mouth daily. 09/25/22   Vu, Gershon Mussel T, MD  sertraline (ZOLOFT) 50 MG tablet Take 1 tablet (50 mg total) by mouth at bedtime. 09/25/22   Vu, Gershon Mussel T, MD  sulfamethoxazole-trimethoprim (BACTRIM DS) 800-160 MG tablet Take 1 tablet by mouth daily. 04/25/22   Kuppelweiser, Cassie L, RPH-CPP  TRAMADOL HCL PO Take by mouth.    [provider]      Allergies    Other    Review of Systems   Review of Systems  Constitutional:  Negative for appetite change and fatigue.   HENT:  Negative for congestion, ear discharge and sinus pressure.   Eyes:  Negative for discharge.  Respiratory:  Negative for cough.   Cardiovascular:  Negative for chest pain.  Gastrointestinal:  Negative for abdominal pain and diarrhea.  Genitourinary:  Negative for frequency and hematuria.  Musculoskeletal:  Negative for back pain.       Bilateral knee pain and swelling  Skin:  Negative for rash.  Neurological:  Positive for weakness. Negative for seizures and headaches.  Psychiatric/Behavioral:  Negative for hallucinations.     Physical Exam Updated Vital Signs BP (!) 181/109 (BP Location: Right Arm)   Pulse 85   Temp 98.6 F (37 C)   Resp 16   Ht 5\' 6"  (1.676 m)   Wt 111.1 kg   SpO2 100%   BMI 39.54 kg/m  Physical Exam Vitals and nursing note reviewed.  Constitutional:      Appearance: She is well-developed.  HENT:     Head: Normocephalic.     Nose: Nose normal.  Eyes:     General: No scleral icterus.    Conjunctiva/sclera: Conjunctivae normal.  Neck:     Thyroid: No thyromegaly.  Cardiovascular:     Rate and Rhythm: Normal rate and regular rhythm.     Heart sounds: No murmur heard.    No friction rub. No gallop.  Pulmonary:     Breath sounds: No stridor. No wheezing or rales.  Chest:     Chest wall: No tenderness.  Abdominal:     General: There is no distension.     Tenderness: There is no abdominal tenderness. There is no rebound.  Musculoskeletal:        General: Normal range of motion.     Cervical back: Neck supple.     Comments: Pain in both knees with swelling bilaterally, worse on the left  Lymphadenopathy:     Cervical: No cervical adenopathy.  Skin:    Findings: No erythema or rash.  Neurological:     Mental Status: She is alert and oriented to person, place, and time.     Motor: No abnormal muscle tone.     Coordination: Coordination normal.  Psychiatric:        Behavior: Behavior normal.     ED Results / Procedures / Treatments    Labs (all labs ordered are listed, but only abnormal results are displayed) Labs Reviewed  COMPREHENSIVE METABOLIC PANEL - Abnormal; Notable for the following components:      Result Value   Potassium 3.3 (*)    Calcium 8.8 (*)    All other components within normal limits  CBC WITH DIFFERENTIAL/PLATELET    EKG None  Radiology VAS [place] LOWER EXTREMITY VENOUS (DVT)  Result Date: 09/27/2022  Lower Venous DVT Study Patient Name:  Melinda Nielsen  Date of Exam:   09/27/2022 Medical Rec #: [id number]        Accession #:    [id number] Date of Birth: 1965-08-18        Patient Gender: F Patient Age:   42 years Exam Location:  [facility] Procedure:      VAS [place] LOWER EXTREMITY VENOUS (DVT) Referring Phys: Cai Flott --------------------------------------------------------------------------------  Indications: Swelling, and Pain.  Risk Factors: Obesity. Limitations: Body habitus. Comparison Study: No prior study Performing Technologist: Shona Simpson  Examination Guidelines: A complete evaluation includes B-mode imaging, spectral Doppler, color Doppler, and power Doppler as needed of all accessible portions of each vessel. Bilateral testing is considered an integral part of a complete examination. Limited examinations for reoccurring indications may be performed as noted. The reflux portion of the exam is performed with the patient in reverse Trendelenburg.  +-----+---------------+---------+-----------+----------+--------------+ RIGHTCompressibilityPhasicitySpontaneityPropertiesThrombus Aging +-----+---------------+---------+-----------+----------+--------------+ CFV  Full           Yes      Yes                                 +-----+---------------+---------+-----------+----------+--------------+   +---------+---------------+---------+-----------+----------+---------------+ LEFT     CompressibilityPhasicitySpontaneityPropertiesThrombus Aging   +---------+---------------+---------+-----------+----------+---------------+ CFV      Full           Yes      Yes                                  +---------+---------------+---------+-----------+----------+---------------+ SFJ      Full                                                         +---------+---------------+---------+-----------+----------+---------------+ FV Prox  Full                                                         +---------+---------------+---------+-----------+----------+---------------+  FV Mid   Full                                                         +---------+---------------+---------+-----------+----------+---------------+ FV DistalFull                                                         +---------+---------------+---------+-----------+----------+---------------+ PFV      Full                                                         +---------+---------------+---------+-----------+----------+---------------+ POP      Full           Yes      Yes                                  +---------+---------------+---------+-----------+----------+---------------+ PTV      Full                                         Patent by color +---------+---------------+---------+-----------+----------+---------------+ PERO                                                  Patent by color +---------+---------------+---------+-----------+----------+---------------+    Summary: RIGHT: - No evidence of common femoral vein obstruction.   LEFT: - There is no evidence of deep vein thrombosis in the lower extremity.  - A cystic structure is found in the popliteal fossa.  *See table(s) above for measurements and observations.    Preliminary     Procedures Procedures    Medications Ordered in ED Medications - No data to display  ED Course/ Medical Decision Making/ A&P                                 Medical Decision Making Amount and/or  Complexity of Data Reviewed Labs: ordered.  Risk Prescription drug management.   Patient with moderate peripheral edema her legs and chronic pains in her knees.  DVT study negative.  Patient placed on prednisone for a week and given Lasix for 3 days and will follow-up with her orthopedic doctor        Final Clinical Impression(s) / ED Diagnoses Final diagnoses:  Peripheral edema    Rx / DC Orders ED Discharge Orders          Ordered    predniSONE (DELTASONE) 10 MG tablet  Daily        09/27/22 1949    furosemide (LASIX) 20 MG tablet        09/27/22 1949  Bethann Berkshire, MD 09/29/22 1133

## 2022-09-27 NOTE — Discharge Instructions (Signed)
Keep your legs elevated and follow-up with your orthopedic doctor

## 2022-09-27 NOTE — Progress Notes (Signed)
Lower extremity venous duplex completed. Please see CV Procedures for preliminary results.  Shona Simpson, RVT 09/27/22 7:36 PM

## 2022-11-30 ENCOUNTER — Other Ambulatory Visit: Payer: Self-pay

## 2022-11-30 DIAGNOSIS — B20 Human immunodeficiency virus [HIV] disease: Secondary | ICD-10-CM

## 2022-11-30 MED ORDER — ROSUVASTATIN CALCIUM 10 MG PO TABS: 10.0000 mg | ORAL_TABLET | Freq: Every day | ORAL | 1 refills | Status: DC

## 2022-12-01 ENCOUNTER — Encounter (HOSPITAL_COMMUNITY): Payer: Self-pay | Admitting: Emergency Medicine

## 2022-12-01 ENCOUNTER — Emergency Department (HOSPITAL_COMMUNITY): Payer: Medicaid Other

## 2022-12-01 ENCOUNTER — Emergency Department (HOSPITAL_COMMUNITY)
Admission: EM | Admit: 2022-12-01 | Discharge: 2022-12-01 | Disposition: A | Discharge status: Home / Self Care | Payer: Medicaid Other | Attending: Emergency Medicine | Admitting: Emergency Medicine

## 2022-12-01 ENCOUNTER — Other Ambulatory Visit: Payer: Self-pay

## 2022-12-01 DIAGNOSIS — R109 Unspecified abdominal pain: Secondary | ICD-10-CM | POA: Insufficient documentation

## 2022-12-01 LAB — CBC WITH DIFFERENTIAL/PLATELET
Abs Immature Granulocytes: 0.01 10*3/uL (ref 0.00–0.07)
Basophils Absolute: 0.1 10*3/uL (ref 0.0–0.1)
Basophils Relative: 1 %
Eosinophils Absolute: 0.2 10*3/uL (ref 0.0–0.5)
Eosinophils Relative: 2 %
HCT: 35.6 % — ABNORMAL LOW (ref 36.0–46.0)
Hemoglobin: 11.5 g/dL — ABNORMAL LOW (ref 12.0–15.0)
Immature Granulocytes: 0 %
Lymphocytes Relative: 36 %
Lymphs Abs: 2.7 10*3/uL (ref 0.7–4.0)
MCH: 27.5 pg (ref 26.0–34.0)
MCHC: 32.3 g/dL (ref 30.0–36.0)
MCV: 85.2 fL (ref 80.0–100.0)
Monocytes Absolute: 0.6 10*3/uL (ref 0.1–1.0)
Monocytes Relative: 8 %
Neutro Abs: 4 10*3/uL (ref 1.7–7.7)
Neutrophils Relative %: 53 %
Platelets: 264 10*3/uL (ref 150–400)
RBC: 4.18 MIL/uL (ref 3.87–5.11)
RDW: 14.9 % (ref 11.5–15.5)
WBC: 7.5 10*3/uL (ref 4.0–10.5)
nRBC: 0 % (ref 0.0–0.2)

## 2022-12-01 LAB — URINALYSIS, ROUTINE W REFLEX MICROSCOPIC
Bacteria, UA: NONE SEEN
Bilirubin Urine: NEGATIVE
Glucose, UA: NEGATIVE mg/dL
Ketones, ur: NEGATIVE mg/dL
Leukocytes,Ua: NEGATIVE
Nitrite: NEGATIVE
Protein, ur: NEGATIVE mg/dL
Specific Gravity, Urine: 1.025 (ref 1.005–1.030)
pH: 5 (ref 5.0–8.0)

## 2022-12-01 LAB — COMPREHENSIVE METABOLIC PANEL
ALT: 15 U/L (ref 0–44)
AST: 15 U/L (ref 15–41)
Albumin: 3.5 g/dL (ref 3.5–5.0)
Alkaline Phosphatase: 96 U/L (ref 38–126)
Anion gap: 8 (ref 5–15)
BUN: 12 mg/dL (ref 6–20)
CO2: 24 mmol/L (ref 22–32)
Calcium: 8.6 mg/dL — ABNORMAL LOW (ref 8.9–10.3)
Chloride: 107 mmol/L (ref 98–111)
Creatinine, Ser: 0.64 mg/dL (ref 0.44–1.00)
GFR, Estimated: >60 mL/min (ref >60)
Glucose, Bld: 97 mg/dL (ref 70–99)
Potassium: 3.5 mmol/L (ref 3.5–5.1)
Sodium: 139 mmol/L (ref 135–145)
Total Bilirubin: 0.4 mg/dL (ref <1.2)
Total Protein: 7.3 g/dL (ref 6.5–8.1)

## 2022-12-01 LAB — LIPASE, BLOOD: Lipase: 29 U/L (ref 11–51)

## 2022-12-01 LAB — D-DIMER, QUANTITATIVE: D-Dimer, Quant: 0.79 ug{FEU}/mL — ABNORMAL HIGH (ref 0.00–0.50)

## 2022-12-01 MED ORDER — HYDROMORPHONE HCL 1 MG/ML IJ SOLN
1.0000 mg | Freq: Once | INTRAMUSCULAR | Status: AC
  Administered 2022-12-01: 1 mg via INTRAVENOUS
  Filled 2022-12-01: qty 1

## 2022-12-01 MED ORDER — KETOROLAC TROMETHAMINE 15 MG/ML IJ SOLN
15.0000 mg | Freq: Once | INTRAMUSCULAR | Status: AC
  Administered 2022-12-01: 15 mg via INTRAVENOUS
  Filled 2022-12-01: qty 1

## 2022-12-01 MED ORDER — IOHEXOL 350 MG/ML SOLN
80.0000 mL | Freq: Once | INTRAVENOUS | Status: AC | PRN
  Administered 2022-12-01: 80 mL via INTRAVENOUS

## 2022-12-01 MED ORDER — ONDANSETRON HCL 4 MG/2ML IJ SOLN
4.0000 mg | Freq: Once | INTRAMUSCULAR | Status: AC
  Administered 2022-12-01: 4 mg via INTRAVENOUS
  Filled 2022-12-01: qty 2

## 2022-12-01 MED ORDER — METHOCARBAMOL 500 MG PO TABS: 500.0000 mg | ORAL_TABLET | Freq: Two times a day (BID) | ORAL | 0 refills | Status: AC | PRN

## 2022-12-01 NOTE — ED Triage Notes (Signed)
Patient presents with right side pain since Wednesday. Tries has tried multiple things, but nothing has helped. Pain has increased since. She mis unsure of what caused the pain.

## 2022-12-01 NOTE — Discharge Instructions (Signed)
Your lab work, CT scan today were reassuring.  Your pain may be due to a muscular cause.  You can take Tylenol, Motrin and the prescribed muscle relaxer.  Do not drive while taking the muscle relaxer as it may make you drowsy.  You should follow-up with your doctor.  If you develop severe pain, chest pain, difficulty breathing or any other new concerning symptoms you should return to the ED.

## 2022-12-01 NOTE — ED Provider Notes (Signed)
Handoff received from prior provider.  Patient reports with right flank pain since Wednesday.  Not worse with eating, occasionally pleuritic.  No shortness of breath or chest pain.  Pain is really in her right upper quadrant.  She is tender here as well.  Lab work here is notable for mild anemia and some mild hematuria but no signs of infection.  Renal stone study is pending.  Chest x-ray is unremarkable.  Plan for follow-up CT and likely discharge.  On my reassessment, her CT scan is unremarkable.  No signs of stone.  She may have staff passed a stone but she is still having some intermittent pain.  She is tender in the right upper quadrant, no pain with eating and her LFTs are normal.  Will obtain ultrasound the right upper quadrant to evaluate possible biliary pathology, as well as D-dimer to rule out PE given intermittent pleuritic component.  D-dimer elevated.  CTA without evidence of PE or other abnormality.  R upper quadrant ultrasound without cholelithiasis.  She is feeling a lot better on reevaluation.  Suspect possible muscular cause.  Will treat with muscle relaxers and have her follow close with her PCP.  I did tell her her blood pressure is quite high and she need to follow-up with her PCP.  Suspect related to pain.   Laurence Spates, MD 12/01/22 [phone]

## 2022-12-01 NOTE — ED Provider Notes (Signed)
[place] EMERGENCY DEPARTMENT AT [facility] Provider Note   CSN: [id number] Arrival date & time: 12/01/22  1136     History  Chief Complaint  Patient presents with   Flank Pain    Melinda Nielsen is a 57 y.o. female.  Patient complains of right flank pain.  She has a history of elevated lipids  The history is provided by the patient and medical records. No language interpreter was used.  Flank Pain This is a new problem. The problem occurs constantly. The problem has not changed since onset.Pertinent negatives include no chest pain, no abdominal pain and no headaches. Nothing aggravates the symptoms. Nothing relieves the symptoms. She has tried nothing for the symptoms. The treatment provided no relief.       Home Medications Prior to Admission medications   Medication Sig Start Date End Date Taking? Authorizing Provider  BIKTARVY 50-200-25 MG TABS tablet TAKE 1 TABLET BY MOUTH DAILY 05/29/22   Vu, Trung T, MD  furosemide (LASIX) 20 MG tablet Take 1 pill every other day for 3 days 09/27/22   Bethann Berkshire, MD  IBUPROFEN PO Take by mouth.    [provider]  losartan (COZAAR) 25 MG tablet TAKE 1 TABLET(25 MG) BY MOUTH DAILY 09/25/22   Vu, Trung T, MD  predniSONE (DELTASONE) 10 MG tablet Take 2 tablets (20 mg total) by mouth daily. 09/27/22   Bethann Berkshire, MD  rosuvastatin (CRESTOR) 10 MG tablet Take 1 tablet (10 mg total) by mouth daily. 11/30/22   Vu, Gershon Mussel T, MD  sertraline (ZOLOFT) 50 MG tablet Take 1 tablet (50 mg total) by mouth at bedtime. 09/25/22   Vu, Gershon Mussel T, MD  sulfamethoxazole-trimethoprim (BACTRIM DS) 800-160 MG tablet Take 1 tablet by mouth daily. 04/25/22   Kuppelweiser, Cassie L, RPH-CPP  TRAMADOL HCL PO Take by mouth.    [provider]      Allergies    Other    Review of Systems   Review of Systems  Constitutional:  Negative for appetite change and fatigue.  HENT:  Negative for congestion, ear discharge and sinus pressure.    Eyes:  Negative for discharge.  Respiratory:  Negative for cough.   Cardiovascular:  Negative for chest pain.  Gastrointestinal:  Negative for abdominal pain and diarrhea.  Genitourinary:  Positive for flank pain. Negative for frequency and hematuria.  Musculoskeletal:  Negative for back pain.  Skin:  Negative for rash.  Neurological:  Negative for seizures and headaches.  Psychiatric/Behavioral:  Negative for hallucinations.     Physical Exam Updated Vital Signs BP (!) 151/125 Comment: Patient restless due to pain  Pulse 77   Temp 98 F (36.7 C) (Oral)   Resp 20   SpO2 99%  Physical Exam Vitals and nursing note reviewed.  Constitutional:      Appearance: She is well-developed.  HENT:     Head: Normocephalic.     Nose: Nose normal.  Eyes:     General: No scleral icterus.    Conjunctiva/sclera: Conjunctivae normal.  Neck:     Thyroid: No thyromegaly.  Cardiovascular:     Rate and Rhythm: Normal rate and regular rhythm.     Heart sounds: No murmur heard.    No friction rub. No gallop.  Pulmonary:     Breath sounds: No stridor. No wheezing or rales.  Chest:     Chest wall: No tenderness.  Abdominal:     General: There is no distension.  Tenderness: There is no abdominal tenderness. There is no rebound.  Musculoskeletal:        General: Normal range of motion.     Cervical back: Neck supple.     Comments: Tender right flank  Lymphadenopathy:     Cervical: No cervical adenopathy.  Skin:    Findings: No erythema or rash.  Neurological:     Mental Status: She is alert and oriented to person, place, and time.     Motor: No abnormal muscle tone.     Coordination: Coordination normal.  Psychiatric:        Behavior: Behavior normal.     ED Results / Procedures / Treatments   Labs (all labs ordered are listed, but only abnormal results are displayed) Labs Reviewed  URINALYSIS, ROUTINE W REFLEX MICROSCOPIC - Abnormal; Notable for the following components:       Result Value   Hgb urine dipstick MODERATE (*)    All other components within normal limits  CBC WITH DIFFERENTIAL/PLATELET - Abnormal; Notable for the following components:   Hemoglobin 11.5 (*)    HCT 35.6 (*)    All other components within normal limits  COMPREHENSIVE METABOLIC PANEL - Abnormal; Notable for the following components:   Calcium 8.6 (*)    All other components within normal limits  LIPASE, BLOOD    EKG None  Radiology No results found.  Procedures Procedures    Medications Ordered in ED Medications  HYDROmorphone (DILAUDID) injection 1 mg (1 mg Intravenous Given 12/01/22 1253)  ondansetron (ZOFRAN) injection 4 mg (4 mg Intravenous Given 12/01/22 1253)    ED Course/ Medical Decision Making/ A&P Clinical Course as of 12/01/22 1806  Fri Dec 01, 2022  1630 R flank to belly, labs okay, ?stone, follow up imaging, likely DC [JD]    Clinical Course User Index [JD] Laurence Spates, MD                                 Medical Decision Making Amount and/or Complexity of Data Reviewed Labs: ordered. Radiology: ordered. ECG/medicine tests: ordered.  Risk Prescription drug management.  Patient with right flank pain.  Labs unremarkable except for urine shows blood.  CT renal stone pending        Final Clinical Impression(s) / ED Diagnoses Final diagnoses:  None    Rx / DC Orders ED Discharge Orders     None         Bethann Berkshire, MD 12/01/22 1807

## 2023-02-01 ENCOUNTER — Ambulatory Visit (INDEPENDENT_AMBULATORY_CARE_PROVIDER_SITE_OTHER): Payer: Medicaid Other | Admitting: Internal Medicine

## 2023-02-01 ENCOUNTER — Encounter: Payer: Self-pay | Admitting: Internal Medicine

## 2023-02-01 ENCOUNTER — Other Ambulatory Visit: Payer: Self-pay

## 2023-02-01 VITALS — BP 172/114 | HR 80 | Temp 97.8°F | Ht 66.0 in | Wt 248.0 lb

## 2023-02-01 DIAGNOSIS — B20 Human immunodeficiency virus [HIV] disease: Secondary | ICD-10-CM | POA: Diagnosis present

## 2023-02-01 NOTE — Progress Notes (Signed)
Subjective:    Patient ID: Melinda Nielsen, female  DOB: February 01, 1965, 58 y.o.        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 She was previously on [place] but was lost to follow up. Previously seen dr Ninetta Lights. Here for routine hiv f/u   02/01/23 clinic f/u Just established new primary care provider -- has some rx given but the pharmacy hasn't seen it Rosuvastatin was changed to something else as patient thinks its causing aches/swelling in her legs Upped dose on anxiety medication No missed dose biktarvy No other complaint today    08/03/22 id clinic f/u Patient had seen our pharmacy team last seen 04/2022 but not yet with me since 04/2021 Doing well on biktarvy; no missed dose last 4 weeks Hiv virologically controlled since 11/2021 Due for all labs today Discussed pap from 04/2022 [facility] Advise her to get pcp  Not sexually active; single; doesn't think she has any std but agrees to screening    Initial visit with me 04/2021 ---------  Patient said she just fell at work and hurt her right leg 03/2021. She stated she wants to get disability so she can take better care of herself, as she doesn't think taking meds/her medical conditions are conducive to taking care of her self properly  #hiv Takes biktarvy. Has been on this from [place] 2 years. No issue with ART failure in the past She haven't been on medication for 9 months. This was due to stress of new job  #hx abnormal pap Saw gynecology who did colposcopy -- didn't hear back from that. 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 She hasn't followed up  #blood pressure She takes losartan  Today's bp is 170s/110s; she said it stays high   Last 9 months not taking any meds   #social -not currently working -lives with family members -"can't have any female" come to her house; she doesn't want letters communcation, only email/call -smoke 1/2 ppd since age 54; no etoh; no other  substance use -no relationship at this time; last sexual encounter 9 months prior to 05/13/21 visit    She last saw dr Ninetta Lights in 03/2020: ----------------- She went to the ED 05-31-10 with 3 weeks of enlarging, painful right neck mass.  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  Her CD4 was 60, VL 43,300. Genotype background PR mutation (K20R,L33Vs).  She returned to ID clinic 06-08-10 and was restarted on atripla. She was eval at health dept and was started on anti-TB medications. She completed these on December 2012.  She returned with thrush and dysphagia January 2021, off ART since 2015.  She was started on biktarvy. She has been off meds (ADAP lapse 09-2019).   Had PAP today.  Her BP is up today. Has been taking her rx, though not yet today.  Had work injury on her L knee, working on getting covered for this.  Has lost 5#, has been working on her diet.  Has decreased her smoking. 1/2 ppd.   HIV 1 RNA Quant  Date Value  08/03/2022 Not Detected Copies/mL  04/25/2022 Not Detected Copies/mL  11/17/2021 NOT DETECTED copies/mL   CD4 T Cell Abs (/uL)  Date Value  04/25/2022 285 (L)  11/17/2021 276 (L)  02/17/2020 273 (L)     Health Maintenance  Topic Date Due   Zoster Vaccines- Shingrix (1 of 2) Never done   Colonoscopy  Never done   COVID-19 Vaccine (3 - Pfizer risk series) 08/14/2019   INFLUENZA VACCINE  08/17/2022   Cervical Cancer Screening (Pap smear)  04/19/2023   MAMMOGRAM  05/18/2023   Pneumococcal Vaccine 110-18 Years old (4 of 4 - PPSV23 or PCV20) 10/01/2029   DTaP/Tdap/Td (2 - Td or Tdap) 11/18/2031   Hepatitis C Screening  Completed   HIV Screening  Completed   HPV VACCINES  Aged Out    ROS: All other ros negative     Objective:   Vitals:   02/01/23 1601  BP: (!) 172/114  Pulse: 80  Temp: 97.8 F (36.6 C)  SpO2: 98%      Physical exam: General/constitutional: no distress, pleasant, obese HEENT: Normocephalic, PER, Conj Clear, EOMI,  Oropharynx clear Neck supple CV: rrr no mrg Lungs: clear to auscultation, normal respiratory effort Abd: Soft, Nontender Ext: trace edema Skin: No Rash Neuro: nonfocal MSK: no peripheral joint swelling/tenderness/warmth; back spines nontender        Assessment & Plan:  #hiv #reprieve Off meds now 9 months as of 05/13/21, reestablishing care Previously on atripla-->biktarvy; no hx virologic failure   07/2022 doing well on biktarvy and wants to stick with it  02/01/23 discussed reprieve trial and that she can benefit on statin. She'll discuss with her pcp regarding which statin is to be given. Currently on crestor but again causing legs pain/swelling    -discussed u=u -encourage compliance -continue current HIV medication -labs today -f/u in 6 months -no longer on bactrim prophylaxis     #right LE trauma (fell and hurt right knee) #chronic pain and focal swelling since 2022  -query complex regional pain syndrome; pcp had referred her to pain clinic       #hx abnormal pap/cin 04/2022 [facility]; advise to repeat end of year  -patient does her pap smear with stephanie dixon; advise f/u there within next few weeks    #htn Off meds as with hiv meds and losartan  Elevated bp  -f/u pcp     #social  Not sexually active for past 3 years since 2022 -- want to defer std testing   #hcm to discuss future visits -vaccination Didn't want flu shot Hep b non-conj vaccine in 2015 Meningococcal 2023 booster Prevnar13 2022 Tdap 2023 -std screen 03/2020 cervical and urine gc chlam negative; resend 07/2022 urine study 11/2021 rpr negative; resend fta/rpr 07/2022 -hepatitis screen 2021 showed prior hepatitis b infection 2021 hep c ab negative Resend hepatitis labs today 07/2022 -cancer screen Advise establishing pcp to have age appropriate cancer screening including breast, colon, and cervical cancer -tb screen Negative quantiferon 2021        Raymondo Band,  MD [facility] [phone]  pager   [phone] cell 02/01/2023, 4:04 PM

## 2023-02-01 NOTE — Addendum Note (Signed)
Addended by: Philippa Chester on: 02/01/2023 04:31 PM   Modules accepted: Orders

## 2023-02-01 NOTE — Patient Instructions (Signed)
Remember to take a statin medication for cholesterol even if it's normal; it is proven to reduce risk for vascular event in patient living with hiv   Continue biktarvy   See me in around 9-12 months   If your regular doctor wants [place] to prescribe anything please forward a chart to [place] and we can happily do

## 2023-02-05 LAB — HIV-1 RNA QUANT-NO REFLEX-BLD
HIV 1 RNA Quant: NOT DETECTED {copies}/mL
HIV-1 RNA Quant, Log: NOT DETECTED {Log}

## 2023-02-05 LAB — T-HELPER CELLS (CD4) COUNT (NOT AT ARMC)
Absolute CD4: 505 {cells}/uL (ref 490–1740)
CD4 T Helper %: 13 % — ABNORMAL LOW (ref 30–61)
Total lymphocyte count: 3834 {cells}/uL (ref 850–3900)

## 2023-03-26 ENCOUNTER — Other Ambulatory Visit: Payer: Self-pay

## 2023-03-26 DIAGNOSIS — B20 Human immunodeficiency virus [HIV] disease: Secondary | ICD-10-CM

## 2023-03-26 MED ORDER — ROSUVASTATIN CALCIUM 10 MG PO TABS
10.0000 mg | ORAL_TABLET | Freq: Every day | ORAL | 1 refills | Status: DC
Start: 2023-03-26 — End: 2023-10-30

## 2023-03-26 MED ORDER — SERTRALINE HCL 50 MG PO TABS: 50.0000 mg | ORAL_TABLET | Freq: Every day | ORAL | 0 refills | Status: AC

## 2023-04-09 ENCOUNTER — Other Ambulatory Visit: Payer: Self-pay | Admitting: Internal Medicine

## 2023-04-09 DIAGNOSIS — B20 Human immunodeficiency virus [HIV] disease: Secondary | ICD-10-CM

## 2023-04-10 NOTE — Telephone Encounter (Signed)
 Refills were sent 3/10 and patient has PCP listed.   Sandie Ano, RN

## 2023-07-06 ENCOUNTER — Other Ambulatory Visit: Payer: Self-pay | Admitting: Internal Medicine

## 2023-07-06 DIAGNOSIS — B20 Human immunodeficiency virus [HIV] disease: Secondary | ICD-10-CM

## 2023-10-01 ENCOUNTER — Other Ambulatory Visit: Payer: Self-pay | Admitting: Internal Medicine

## 2023-10-04 ENCOUNTER — Other Ambulatory Visit (HOSPITAL_COMMUNITY): Payer: Self-pay

## 2023-10-30 ENCOUNTER — Ambulatory Visit (INDEPENDENT_AMBULATORY_CARE_PROVIDER_SITE_OTHER): Admitting: Internal Medicine

## 2023-10-30 ENCOUNTER — Encounter: Payer: Self-pay | Admitting: Internal Medicine

## 2023-10-30 ENCOUNTER — Other Ambulatory Visit: Payer: Self-pay

## 2023-10-30 VITALS — BP 152/97 | HR 93 | Temp 97.4°F | Resp 16 | Wt 248.6 lb

## 2023-10-30 DIAGNOSIS — Z79899 Other long term (current) drug therapy: Secondary | ICD-10-CM | POA: Diagnosis not present

## 2023-10-30 DIAGNOSIS — F17210 Nicotine dependence, cigarettes, uncomplicated: Secondary | ICD-10-CM

## 2023-10-30 DIAGNOSIS — Z720 Tobacco use: Secondary | ICD-10-CM

## 2023-10-30 DIAGNOSIS — B20 Human immunodeficiency virus [HIV] disease: Secondary | ICD-10-CM | POA: Diagnosis not present

## 2023-10-30 DIAGNOSIS — M1711 Unilateral primary osteoarthritis, right knee: Secondary | ICD-10-CM | POA: Diagnosis not present

## 2023-10-30 DIAGNOSIS — Z23 Encounter for immunization: Secondary | ICD-10-CM

## 2023-10-30 MED ORDER — ATORVASTATIN CALCIUM 40 MG PO TABS: 40.0000 mg | ORAL_TABLET | Freq: Every day | ORAL | 11 refills | Status: AC

## 2023-10-30 MED ORDER — NICOTINE POLACRILEX 2 MG MT GUM: 2.0000 mg | CHEWING_GUM | Freq: Four times a day (QID) | OROMUCOSAL | 0 refills | Status: AC | PRN

## 2023-10-30 MED ORDER — BICTEGRAVIR-EMTRICITAB-TENOFOV 50-200-25 MG PO TABS: 1.0000 | ORAL_TABLET | Freq: Every day | ORAL | 11 refills | Status: AC

## 2023-10-30 MED ORDER — NICOTINE 14 MG/24HR TD PT24: MEDICATED_PATCH | TRANSDERMAL | 0 refills | Status: DC

## 2023-10-30 NOTE — Patient Instructions (Signed)
 For tobacco stoppage -decide on a date to stop smoking -put a patch on the day before -- use one patch once a day for 8 weeks -chew nicotine  gum as needed up to 4 times a day for extra withdrawal control   See me in 2 months so we can go down on nicotine  patch dosing    For hiv: Labs today Renew biktarvy   Atorvastatin for cholesterol need to be at 40 mg and I have changed the order

## 2023-10-30 NOTE — Progress Notes (Signed)
 Subjective:    Patient ID: Melinda Nielsen, female  DOB: Sep 01, 1965, 58 y.o.        MRN: [id number]   HPI 58 yo F with hx of HIV+ since 2006 when pregnant (son -). Here for routine hiv f/u   10/30/23 id clinic f/u Continues on biktarvy and no missed dose last 4 weeks Due for labs today Taking atorvastatin -- only 10 mg daily though Viral load remains in good control since 2023 Want to quit smoking Right knee OA getting injection at [facility] and on pain contract    02/01/23 clinic f/u Just established new primary care provider -- has some rx given but the pharmacy hasn't seen it Rosuvastatin  was changed to something else as patient thinks its causing aches/swelling in her legs Upped dose on anxiety medication No missed dose biktarvy No other complaint today    08/03/22 id clinic f/u Patient had seen our pharmacy team last seen 04/2022 but not yet with me since 04/2021 Doing well on biktarvy; no missed dose last 4 weeks Hiv virologically controlled since 11/2021 Due for all labs today Discussed pap from 04/2022 [facility] Advise her to get pcp  Not sexually active; single; doesn't think she has any std but agrees to screening    Initial visit with me 04/2021 ---------  Patient said she just fell at work and hurt her right leg 03/2021. She stated she wants to get disability so she can take better care of herself, as she doesn't think taking meds/her medical conditions are conducive to taking care of her self properly  #hiv Takes biktarvy. Has been on this from [place] 2 years. No issue with ART failure in the past She haven't been on medication for 9 months. This was due to stress of new job  #hx abnormal pap Saw gynecology who did colposcopy -- didn't hear back from that. 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 She hasn't followed up  #blood pressure She takes losartan   Today's bp is 170s/110s; she said it stays high   Last 9 months not taking any  meds   #social -not currently working -lives with family members -can't have any female come to her house; she doesn't want letters communcation, only email/call -smoke 1/2 ppd since age 81; no etoh; no other substance use -no relationship at this time; last sexual encounter 9 months prior to 05/13/21 visit    She last saw dr Eben in 03/2020: ----------------- She went to the ED 05-31-10 with 3 weeks of enlarging, painful right neck mass.  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  Her CD4 was 60, VL 43,300. Genotype background PR mutation (K20R,L33Vs).  She returned to ID clinic 06-08-10 and was restarted on atripla. She was eval at health dept and was started on anti-TB medications. She completed these on December 2012.  She returned with thrush and dysphagia January 2021, off ART since 2015.  She was started on biktarvy. She has been off meds (ADAP lapse 09-2019).   Had PAP today.  Her BP is up today. Has been taking her rx, though not yet today.  Had work injury on her L knee, working on getting covered for this.  Has lost 5#, has been working on her diet.  Has decreased her smoking. 1/2 ppd.   HIV 1 RNA Quant (Copies/mL)  Date Value  02/01/2023 Not Detected  08/03/2022 Not Detected  04/25/2022 Not Detected   CD4 T Cell Abs (/uL)  Date Value  04/25/2022 285 (L)  11/17/2021 276 (L)  02/17/2020 273 (L)     Health Maintenance  Topic Date Due   Zoster Vaccines- Shingrix (1 of 2) Never done   Colonoscopy  Never done   COVID-19 Vaccine (3 - Pfizer risk series) 08/14/2019   Cervical Cancer Screening (Pap smear)  04/19/2023   Mammogram  05/18/2023   Influenza Vaccine  08/17/2023   Pneumococcal Vaccine: 50+ Years (4 of 4 - PCV20 or PCV21) 02/16/2025   DTaP/Tdap/Td (2 - Td or Tdap) 11/18/2031   Hepatitis B Vaccines 19-59 Average Risk  Completed   Hepatitis C Screening  Completed   HIV Screening  Completed   HPV VACCINES  Aged Out   Meningococcal B Vaccine   Aged Out    ROS: All other ros negative     Objective:   Vitals:   10/30/23 1057  BP: (!) 152/97  Pulse: 93  Resp: 16  Temp: (!) 97.4 F (36.3 C)  SpO2: 98%       Physical exam: General/constitutional: no distress, pleasant, obese HEENT: Normocephalic, PER, Conj Clear, EOMI, Oropharynx clear Neck supple CV: rrr no mrg Lungs: clear to auscultation, normal respiratory effort Abd: Soft, Nontender Ext: trace edema Skin: No Rash Neuro: nonfocal MSK: no peripheral joint swelling/tenderness/warmth; back spines nontender   Labs: Lab Results  Component Value Date   WBC 7.5 12/01/2022   HGB 11.5 (L) 12/01/2022   HCT 35.6 (L) 12/01/2022   MCV 85.2 12/01/2022   PLT 264 12/01/2022   Last metabolic panel Lab Results  Component Value Date   GLUCOSE 97 12/01/2022   NA 139 12/01/2022   K 3.5 12/01/2022   CL 107 12/01/2022   CO2 24 12/01/2022   BUN 12 12/01/2022   CREATININE 0.64 12/01/2022   GFRNONAA >60 12/01/2022   CALCIUM  8.6 (L) 12/01/2022   PROT 7.3 12/01/2022   ALBUMIN 3.5 12/01/2022   BILITOT 0.4 12/01/2022   ALKPHOS 96 12/01/2022   AST 15 12/01/2022   ALT 15 12/01/2022   ANIONGAP 8 12/01/2022   HIV: Lab Results  Component Value Date   HIV1RNAQUANT Not Detected 02/01/2023   Lab Results  Component Value Date   CD4TCELL 13 (L) 02/01/2023   CD4TABS 285 (L) 04/25/2022   Cd4 505 01/2023      Assessment & Plan:  #hiv #reprieve Off meds now 9 months as of 05/13/21, reestablishing care Previously on atripla-->biktarvy; no hx virologic failure   07/2022 doing well on biktarvy and wants to stick with it  02/01/23 discussed reprieve trial and that she can benefit on statin. She'll discuss with her pcp regarding which statin is to be given. Currently on crestor  but again causing legs pain/swelling  10/2023 compliant with biktarvy. Taking statin (atorva) but low dose   -discussed u=u -encourage compliance -continue current HIV medication  biktarvy -increased atorvastatin to 40 mg daily -labs today -f/u 12 months     #hx abnormal pap/cin 04/2022 [facility]; advise to repeat end of year  -patient does her pap smear with [facility]; advise f/u there within next few weeks     #social  Not sexually active for past 3 years since 2022 -- want to defer std testing On disability Smoking -- 1 ppd No etoh/drugs   #tobacco dependence  Patient wants to quit  -nicotine  patch 14 mcg tapered every 8 weeks -f/u in 2 months for a lower dose nicotine  patch prescription    #right knee pain Getting ortho steroid injection in knee Takes tramadol  --  follows pain clinic   -f/u ortho and pain clinic     #hcm to discuss future visits -vaccination Didn't want flu shot Hep b non-conj vaccine in 2015 Meningococcal 2023 booster Prevnar13 2022 Tdap 2023 -hepatitis  -std screen 03/2020 cervical and urine gc chlam negative; resend 07/2022 urine study 07/2022 rpr negative -hepatitis screen 2021 showed prior hepatitis b infection 2021 hep c ab negative Resend hepatitis labs today 07/2022 --> hep b sAb >1000 -cancer screen Advise establishing pcp to have age appropriate cancer screening including breast, colon, and cervical cancer -tb screen Negative quantiferon 2021        Constance ONEIDA Passer, MD [facility] for Infectious Disease [facility] [phone]  pager   [phone] cell 10/30/2023, 10:59 AM

## 2023-10-31 LAB — T-HELPER CELLS (CD4) COUNT (NOT AT ARMC)
CD4 % Helper T Cell: 14 % — ABNORMAL LOW (ref 33–65)
CD4 T Cell Abs: 424 /uL (ref 400–1790)

## 2023-11-01 LAB — COMPLETE METABOLIC PANEL WITHOUT GFR
AG Ratio: 1.3 (calc) (ref 1.0–2.5)
ALT: 10 U/L (ref 6–29)
AST: 14 U/L (ref 10–35)
Albumin: 4.3 g/dL (ref 3.6–5.1)
Alkaline phosphatase (APISO): 111 U/L (ref 37–153)
BUN: 14 mg/dL (ref 7–25)
CO2: 27 mmol/L (ref 20–32)
Calcium: 9.6 mg/dL (ref 8.6–10.4)
Chloride: 104 mmol/L (ref 98–110)
Creat: 0.68 mg/dL (ref 0.50–1.03)
Globulin: 3.2 g/dL (ref 1.9–3.7)
Glucose, Bld: 104 mg/dL — ABNORMAL HIGH (ref 65–99)
Potassium: 3.8 mmol/L (ref 3.5–5.3)
Sodium: 139 mmol/L (ref 135–146)
Total Bilirubin: 0.3 mg/dL (ref 0.2–1.2)
Total Protein: 7.5 g/dL (ref 6.1–8.1)

## 2023-11-01 LAB — CBC
HCT: 41.3 % (ref 35.0–45.0)
Hemoglobin: 13.5 g/dL (ref 11.7–15.5)
MCH: 27.7 pg (ref 27.0–33.0)
MCHC: 32.7 g/dL (ref 32.0–36.0)
MCV: 84.6 fL (ref 80.0–100.0)
MPV: 11.7 fL (ref 7.5–12.5)
Platelets: 288 Thousand/uL (ref 140–400)
RBC: 4.88 Million/uL (ref 3.80–5.10)
RDW: 14.3 % (ref 11.0–15.0)
WBC: 8.7 Thousand/uL (ref 3.8–10.8)

## 2023-11-01 LAB — HIV-1 RNA QUANT-NO REFLEX-BLD
HIV 1 RNA Quant: NOT DETECTED {copies}/mL
HIV-1 RNA Quant, Log: NOT DETECTED {Log_copies}/mL

## 2023-11-29 ENCOUNTER — Other Ambulatory Visit (HOSPITAL_COMMUNITY): Payer: Self-pay

## 2023-12-10 NOTE — Progress Notes (Signed)
 The 10-year ASCVD risk score (Arnett DK, et al., 2019) is: 9.3%   Values used to calculate the score:     Age: 58 years     Clincally relevant sex: Female     Is Non-Hispanic African American: Yes     Diabetic: No     Tobacco smoker: No     Systolic Blood Pressure: 148 mmHg     Is BP treated: Yes     HDL Cholesterol: 58 mg/dL     Total Cholesterol: 195 mg/dL  Currently prescribed atorvastatin  40 mg.  Anthany Thornhill, BSN, RN

## 2024-01-01 ENCOUNTER — Other Ambulatory Visit: Payer: Self-pay

## 2024-01-01 ENCOUNTER — Encounter: Payer: Self-pay | Admitting: Internal Medicine

## 2024-01-01 ENCOUNTER — Ambulatory Visit: Admitting: Internal Medicine

## 2024-01-01 VITALS — BP 151/82 | HR 88 | Resp 16 | Wt 252.0 lb

## 2024-01-01 DIAGNOSIS — B20 Human immunodeficiency virus [HIV] disease: Secondary | ICD-10-CM | POA: Diagnosis present

## 2024-01-01 DIAGNOSIS — Z720 Tobacco use: Secondary | ICD-10-CM

## 2024-01-01 MED ORDER — NICOTINE 7 MG/24HR TD PT24: 7.0000 mg | MEDICATED_PATCH | Freq: Every day | TRANSDERMAL | 0 refills | Status: AC

## 2024-01-01 NOTE — Progress Notes (Signed)
 Subjective:    Patient ID: Melinda Nielsen, female  DOB: October 05, 1965, 58 y.o.        MRN: [id number]   HPI 58 yo F with hx of HIV+ since 2006 when pregnant (son -). Here for routine hiv f/u  01/01/24 id clinic f/u Patient here for f/u nicotine  patch use -- side effects Not yet done pap   10/30/23 id clinic f/u Continues on biktarvy and no missed dose last 4 weeks Due for labs today Taking atorvastatin  -- only 10 mg daily though Viral load remains in good control since 2023 Want to quit smoking Right knee OA getting injection at [facility] and on pain contract    02/01/23 clinic f/u Just established new primary care provider -- has some rx given but the pharmacy hasn't seen it Rosuvastatin  was changed to something else as patient thinks its causing aches/swelling in her legs Upped dose on anxiety medication No missed dose biktarvy No other complaint today    08/03/22 id clinic f/u Patient had seen our pharmacy team last seen 04/2022 but not yet with me since 04/2021 Doing well on biktarvy; no missed dose last 4 weeks Hiv virologically controlled since 11/2021 Due for all labs today Discussed pap from 04/2022 [facility] Advise her to get pcp  Not sexually active; single; doesn't think she has any std but agrees to screening    Initial visit with me 04/2021 ---------  Patient said she just fell at work and hurt her right leg 03/2021. She stated she wants to get disability so she can take better care of herself, as she doesn't think taking meds/her medical conditions are conducive to taking care of her self properly  #hiv Takes biktarvy. Has been on this from [place] 2 years. No issue with ART failure in the past She haven't been on medication for 9 months. This was due to stress of new job  #hx abnormal pap Saw gynecology who did colposcopy -- didn't hear back from that. 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 She hasn't followed up  #blood pressure She takes losartan    Today's bp is 170s/110s; she said it stays high   Last 9 months not taking any meds   #social -not currently working -lives with family members -can't have any female come to her house; she doesn't want letters communcation, only email/call -smoke 1/2 ppd since age 58; no etoh; no other substance use -no relationship at this time; last sexual encounter 9 months prior to 05/13/21 visit    She last saw dr Eben in 03/2020: ----------------- She went to the ED 05-31-10 with 3 weeks of enlarging, painful right neck mass.  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  Her CD4 was 60, VL 43,300. Genotype background PR mutation (K20R,L33Vs).  She returned to ID clinic 06-08-10 and was restarted on atripla. She was eval at health dept and was started on anti-TB medications. She completed these on December 2012.  She returned with thrush and dysphagia January 2021, off ART since 2015.  She was started on biktarvy. She has been off meds (ADAP lapse 09-2019).   Had PAP today.  Her BP is up today. Has been taking her rx, though not yet today.  Had work injury on her L knee, working on getting covered for this.  Has lost 5#, has been working on her diet.  Has decreased her smoking. 1/2 ppd.   HIV 1 RNA Quant  Date Value  10/30/2023 NOT DETECTED  copies/mL  02/01/2023 Not Detected Copies/mL  08/03/2022 Not Detected Copies/mL   CD4 T Cell Abs (/uL)  Date Value  10/30/2023 424  04/25/2022 285 (L)  11/17/2021 276 (L)     Health Maintenance  Topic Date Due   Medicare Annual Wellness (AWV)  Never done   Zoster Vaccines- Shingrix (1 of 2) Never done   Colonoscopy  Never done   COVID-19 Vaccine (3 - Pfizer risk series) 08/14/2019   Cervical Cancer Screening (Pap smear)  04/19/2023   Mammogram  05/18/2023   Pneumococcal Vaccine: 50+ Years (4 of 4 - PCV20 or PCV21) 02/16/2025   DTaP/Tdap/Td (2 - Td or Tdap) 11/18/2031   Influenza Vaccine  Completed   Hepatitis B Vaccines 19-59  Average Risk  Completed   Hepatitis C Screening  Completed   HIV Screening  Completed   HPV VACCINES  Aged Out   Meningococcal B Vaccine  Aged Out    ROS: All other ros negative     Objective:   There were no vitals filed for this visit.      Physical exam: General/constitutional: no distress, pleasant, obese HEENT: Normocephalic, PER, Conj Clear, EOMI, Oropharynx clear Neck supple CV: rrr no mrg Lungs: clear to auscultation, normal respiratory effort Abd: Soft, Nontender Ext: trace edema Skin: No Rash Neuro: nonfocal MSK: no peripheral joint swelling/tenderness/warmth; back spines nontender   Labs: Lab Results  Component Value Date   WBC 8.7 10/30/2023   HGB 13.5 10/30/2023   HCT 41.3 10/30/2023   MCV 84.6 10/30/2023   PLT 288 10/30/2023   Last metabolic panel Lab Results  Component Value Date   GLUCOSE 104 (H) 10/30/2023   NA 139 10/30/2023   K 3.8 10/30/2023   CL 104 10/30/2023   CO2 27 10/30/2023   BUN 14 10/30/2023   CREATININE 0.68 10/30/2023   GFRNONAA >60 12/01/2022   CALCIUM  9.6 10/30/2023   PROT 7.5 10/30/2023   ALBUMIN 3.5 12/01/2022   BILITOT 0.3 10/30/2023   ALKPHOS 96 12/01/2022   AST 14 10/30/2023   ALT 10 10/30/2023   ANIONGAP 8 12/01/2022   HIV: Lab Results  Component Value Date   HIV1RNAQUANT NOT DETECTED 10/30/2023   Lab Results  Component Value Date   CD4TCELL 14 (L) 10/30/2023   CD4TABS 424 10/30/2023   Cd4 505 01/2023      Assessment & Plan:   #tobacco dependence  Patient wants to quit 10/2023 started her on nicotine  patch 14 mcg -- too much palpitation/jittery. Wasn't taking prednisone  or other meds with it. She had used it for 3 days. Wasn't smoking at that time either  She is back to smoking since  -we'll try 7mcg patch today for 8 weeks; if still similar symptoms can try wellbutrin -f/u [facility] in about 4-6 weeks for pap smear and discussing this      Didn't address  today ---------------- #hiv #reprieve Off meds now 9 months as of 05/13/21, reestablishing care Previously on atripla-->biktarvy; no hx virologic failure   07/2022 doing well on biktarvy and wants to stick with it  02/01/23 discussed reprieve trial and that she can benefit on statin. She'll discuss with her pcp regarding which statin is to be given. Currently on crestor  but again causing legs pain/swelling  10/2023 compliant with biktarvy. Taking statin (atorva) but low dose   -discussed u=u -encourage compliance -continue current HIV medication biktarvy -increased atorvastatin  to 40 mg daily -labs today -f/u 12 months     #hx abnormal pap/cin 04/2022  [facility]; advise to repeat end of year  -patient does her pap smear with [facility]; advise f/u there within next few weeks     #social  Not sexually active for past 3 years since 2022 -- want to defer std testing On disability Smoking -- 1 ppd No etoh/drugs      #right knee pain Getting ortho steroid injection in knee Takes tramadol  -- follows pain clinic   -f/u ortho and pain clinic     #hcm to discuss future visits -vaccination Didn't want flu shot Hep b non-conj vaccine in 2015 Meningococcal 2023 booster Prevnar13 2022 Tdap 2023 -hepatitis  -std screen 03/2020 cervical and urine gc chlam negative; resend 07/2022 urine study 07/2022 rpr negative -hepatitis screen 2021 showed prior hepatitis b infection 2021 hep c ab negative Resend hepatitis labs today 07/2022 --> hep b sAb >1000 -cancer screen Advise establishing pcp to have age appropriate cancer screening including breast, colon, and cervical cancer -tb screen Negative quantiferon 2021        Constance ONEIDA Passer, MD [facility] for Infectious Disease [facility] [phone]  pager   [phone] cell 01/01/2024, 11:29 AM

## 2024-01-01 NOTE — Patient Instructions (Addendum)
 Please follow up with [facility] with our clinic for pap smear   For hiv, you are good and no need to check labs at this time; see me 12/2024   Tobacco

## 2024-02-01 ENCOUNTER — Ambulatory Visit: Payer: Self-pay | Admitting: Infectious Diseases

## 2024-07-07 ENCOUNTER — Ambulatory Visit: Payer: Self-pay | Admitting: Infectious Diseases
